# Patient Record
Sex: Female | Born: 1995 | Race: White | Hispanic: No | Marital: Single | Smoking: Never smoker
Health system: Southern US, Community
[De-identification: ages and names within clinical notes are randomized; demographics above are authoritative.]

## PROBLEM LIST (undated history)

## (undated) DIAGNOSIS — L639 Alopecia areata, unspecified: Secondary | ICD-10-CM

## (undated) DIAGNOSIS — F419 Anxiety disorder, unspecified: Secondary | ICD-10-CM

## (undated) DIAGNOSIS — G43909 Migraine, unspecified, not intractable, without status migrainosus: Secondary | ICD-10-CM

## (undated) DIAGNOSIS — F32A Depression, unspecified: Secondary | ICD-10-CM

## (undated) HISTORY — DX: Anxiety disorder, unspecified: F41.9

## (undated) HISTORY — PX: OTHER SURGICAL HISTORY: SHX169

## (undated) HISTORY — PX: TYMPANOSTOMY TUBE PLACEMENT: SHX32

## (undated) HISTORY — DX: Migraine, unspecified, not intractable, without status migrainosus: G43.909

## (undated) HISTORY — DX: Alopecia areata, unspecified: L63.9

## (undated) HISTORY — DX: Depression, unspecified: F32.A

---
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
## 2013-03-17 ENCOUNTER — Encounter: Payer: Self-pay | Admitting: Neurology

## 2013-03-17 ENCOUNTER — Ambulatory Visit (INDEPENDENT_AMBULATORY_CARE_PROVIDER_SITE_OTHER): Payer: 59 | Admitting: Neurology

## 2013-03-17 VITALS — BP 99/55 | HR 72 | Ht 64.0 in | Wt 104.0 lb

## 2013-03-17 DIAGNOSIS — F411 Generalized anxiety disorder: Secondary | ICD-10-CM

## 2013-03-17 DIAGNOSIS — G43909 Migraine, unspecified, not intractable, without status migrainosus: Secondary | ICD-10-CM | POA: Insufficient documentation

## 2013-03-17 DIAGNOSIS — F419 Anxiety disorder, unspecified: Secondary | ICD-10-CM | POA: Insufficient documentation

## 2013-03-17 MED ORDER — RIZATRIPTAN BENZOATE 10 MG PO TABS: 10.0000 mg | ORAL_TABLET | ORAL | Status: DC | PRN

## 2013-03-17 MED ORDER — NORTRIPTYLINE HCL 10 MG PO CAPS: 20.0000 mg | ORAL_CAPSULE | Freq: Every day | ORAL | Status: DC

## 2013-03-17 NOTE — Patient Instructions (Signed)
magnesium oxide 400mg , riboflavin 100mg  twice a day

## 2013-03-17 NOTE — Progress Notes (Signed)
[facility]  PATIENT: Cynthia Reynolds DOB: 03/04/1996  HISTORICAL  Cynthia Reynolds is 17 years old right-handed Caucasian female, accompanied by her mother, referred by her primary care physician Dr. Duane Lope for evaluation of headaches  She had past medical history of migraine headaches, anxiety, currently a college student  Her migraine started about 3 years ago, in 2011, bifrontal, vertex area headaches, getting worse over the past 2 years, it was attributed to her stress, her headace continues to get worse, to the point of headache every other day, she took Advil 3 tablets 3-4 times each week to treat her headache, it can last all day, or few hours, pressure, occasionally pounding, light sensitivity, worsening by movement,  Trigger for migraine are menstruation, stress, she denies visual change, no lateralized motor or sensory deficit.  REVIEW OF SYSTEMS: Full 14 system review of systems performed and notable only for blurry vision, muscle cramping, occasionally constipation, headaches, sleepiness, dizziness,  ALLERGIES: Allergies  Allergen Reactions  . Amoxicillin     Swelling and rash  . Augmentin (Amoxicillin-Pot Clavulanate)     Swelling and rash  . Amoxapine And Related   . Biaxin (Clarithromycin)     rash  . Cefzil (Cefprozil)     rash  . Septra (Sulfamethoxazole W-Trimethoprim)     rash    HOME MEDICATIONS: No outpatient prescriptions prior to visit.   No facility-administered medications prior to visit.    PAST MEDICAL HISTORY: Past Medical History  Diagnosis Date  . Migraine   . Anxiety     PAST SURGICAL HISTORY: Past Surgical History  Procedure Laterality Date  . Tubes in ears Left     2011    FAMILY HISTORY: Family History  Problem Relation Age of Onset  . Migraines Mother   . Migraines Father   . High blood pressure Maternal Grandmother   . High blood pressure Maternal Grandmother   . High blood pressure Maternal Aunt      SOCIAL HISTORY:  History   Social History  . Marital Status: Single    Spouse Name: N/A    Number of Children: 0  . Years of Education: college   Occupational History  . STUDENT     [employer]   Social History Main Topics  . Smoking status: Never Smoker   . Smokeless tobacco: Never Used  . Alcohol Use: No  . Drug Use: No  . Sexually Active: Not on file   Other Topics Concern  . Not on file   Social History Narrative   Patient lives at home with her parents. Patient goes to [employer].    Right handed   Caffeine - 2 cups     PHYSICAL EXAM    Filed Vitals:   03/17/13 1247  BP: 99/55  Pulse: 72  Height: 5\' 4"  (1.626 m)  Weight: 104 lb (47.174 kg)    Not recorded    Body mass index is 17.84 kg/(m^2).   Generalized: In no acute distress  Neck: Supple, no carotid bruits   Cardiac: Regular rate rhythm  Pulmonary: Clear to auscultation bilaterally  Musculoskeletal: No deformity  Neurological examination  Mentation: Alert oriented to time, place, history taking, and causual conversation  Cranial nerve II-XII: Pupils were equal round reactive to light extraocular movements were full, visual field were full on confrontational test. facial sensation and strength were normal. hearing was intact to finger rubbing bilaterally. Uvula tongue midline.  head turning and shoulder shrug and were normal and  symmetric.Tongue protrusion into cheek strength was normal.  Motor: normal tone, bulk and strength.  Sensory: Intact to fine touch, pinprick, preserved vibratory sensation, and proprioception at toes.  Coordination: Normal finger to nose, heel-to-shin bilaterally there was no truncal ataxia  Gait: Rising up from seated position without assistance, normal stance, without trunk ataxia, moderate stride, good arm swing, smooth turning, able to perform tiptoe, and heel walking without difficulty.   Romberg signs: Negative  Deep tendon reflexes: Brachioradialis 2/2,  biceps 2/2, triceps 2/2, patellar 2/2, Achilles 2/2, plantar responses were flexor bilaterally.   DIAGNOSTIC DATA (LABS, IMAGING, TESTING) - I reviewed patient records, labs, notes, testing and imaging myself where available.  Lab Results  Component Value Date   WBC 8.5 10/20/2008   HGB 14.2 10/20/2008   HCT 42.3 10/20/2008   MCV 91.0 10/20/2008   PLT 242 10/20/2008      Component Value Date/Time   NA 142 10/20/2008 1130   K 3.8 10/20/2008 1130   CL 109 10/20/2008 1130   CO2 27 10/20/2008 1130   GLUCOSE 80 10/20/2008 1130   BUN 8 10/20/2008 1130   CREATININE 0.44 10/20/2008 1130   CALCIUM 9.9 10/20/2008 1130   PROT 7.3 10/20/2008 1130   ALBUMIN 4.2 10/20/2008 1130   AST 40* 10/20/2008 1130   ALT 45* 10/20/2008 1130   ALKPHOS 133 10/20/2008 1130   BILITOT 0.9 10/20/2008 1130   GFRNONAA NOT CALCULATED 10/20/2008 1130   GFRAA  Value: NOT CALCULATED        The eGFR has been calculated using the MDRD equation. This calculation has not been validated in all clinical situations. eGFR's persistently <60 mL/min signify possible Chronic Kidney Disease. 10/20/2008 1130    ASSESSMENT AND PLAN 17 years old patient female, with migraine headaches, likely a component of medicine  rebound headache, normal neurological examination  1. nortriptyline 10 mg, titrating to 20 mg every night as preventive medications 2 Maxalt as needed only for severe migraine headaches, cutback frequent Advil use 3 magnesium oxide, riboflavin twice a day.       Levert Feinstein, M.D. Ph.D.  [facility] [address] [place], [place] [address] [phone]

## 2014-10-01 DIAGNOSIS — H7291 Unspecified perforation of tympanic membrane, right ear: Secondary | ICD-10-CM | POA: Insufficient documentation

## 2017-09-30 ENCOUNTER — Encounter: Payer: Self-pay | Admitting: Allergy and Immunology

## 2018-08-18 DIAGNOSIS — F3342 Major depressive disorder, recurrent, in full remission: Secondary | ICD-10-CM | POA: Insufficient documentation

## 2020-12-08 ENCOUNTER — Ambulatory Visit (INDEPENDENT_AMBULATORY_CARE_PROVIDER_SITE_OTHER): Payer: PRIVATE HEALTH INSURANCE | Admitting: Medical-Surgical

## 2020-12-08 ENCOUNTER — Encounter: Payer: Self-pay | Admitting: Medical-Surgical

## 2020-12-08 ENCOUNTER — Other Ambulatory Visit: Payer: Self-pay

## 2020-12-08 VITALS — BP 100/67 | HR 98 | Temp 98.7°F | Ht 64.0 in | Wt 114.2 lb

## 2020-12-08 DIAGNOSIS — H66002 Acute suppurative otitis media without spontaneous rupture of ear drum, left ear: Secondary | ICD-10-CM | POA: Diagnosis not present

## 2020-12-08 DIAGNOSIS — L659 Nonscarring hair loss, unspecified: Secondary | ICD-10-CM

## 2020-12-08 DIAGNOSIS — Z7689 Persons encountering health services in other specified circumstances: Secondary | ICD-10-CM

## 2020-12-08 DIAGNOSIS — Z1159 Encounter for screening for other viral diseases: Secondary | ICD-10-CM

## 2020-12-08 DIAGNOSIS — Z114 Encounter for screening for human immunodeficiency virus [HIV]: Secondary | ICD-10-CM

## 2020-12-08 MED ORDER — CLOBETASOL PROPIONATE 0.05 % EX SOLN: 1.0000 "application " | Freq: Two times a day (BID) | CUTANEOUS | 0 refills | Status: DC

## 2020-12-08 MED ORDER — AZITHROMYCIN 250 MG PO TABS: ORAL_TABLET | ORAL | 0 refills | Status: DC

## 2020-12-08 NOTE — Progress Notes (Signed)
New Patient Office Visit  Subjective:  Patient ID: Cynthia Reynolds, female    DOB: October 10, 1995  Age: 25 y.o. MRN: [id number]  CC:  Chief Complaint  Patient presents with  . Establish Care  . Alopecia    HPI Cynthia Reynolds presents to establish care.   Alopecia- seen in 2020 for hair loss on the left side of the head just behind the ear. Saw dermatology for one visit and was diagnosed with alopecia areata. They gave her Clobetasol solution to use twice daily until hair growth restarted then decrease to once daily. She had an insurance change and was unable to follow up with them. She is currently out of the solution and notes that she has again started losing hair in the same area. She is vegetarian and trends towards starchy, cheesy foods. Not taking a daily MVI.   Left ear- having some pain and muffled hearing in her left ear. Saw UC for her symptoms two weeks ago and they recommended Zyrtec-D for it. She has done this as directed for the last two weeks and had no improvement. No using steroid nasal spray. Long history of ear problems with multiple surgeries/procedures. Now waking with ear pain and some intermittent dizziness. No fevers, chills, or other upper respiratory symptoms.   Past Medical History:  Diagnosis Date  . Anxiety   . Migraine     Past Surgical History:  Procedure Laterality Date  . tubes in ears Left    2011    Family History  Problem Relation Age of Onset  . Migraines Mother   . Migraines Father   . High blood pressure Maternal Grandmother   . High blood pressure Maternal Aunt     Social History   Socioeconomic History  . Marital status: Single    Spouse name: Not on file  . Number of children: 0  . Years of education: college  . Highest education level: Not on file  Occupational History  . Occupation: Dentist: UNEMPLOYED    Comment: [employer]  Tobacco Use  . Smoking status: Never Smoker  . Smokeless tobacco: Never Used  Substance  and Sexual Activity  . Alcohol use: Yes    Alcohol/week: 1.0 - 2.0 standard drink    Types: 1 - 2 Standard drinks or equivalent per week  . Drug use: No  . Sexual activity: Yes    Birth control/protection: Pill  Other Topics Concern  . Not on file  Social History Narrative   Patient lives at home with her parents. Patient goes to [employer].    Right handed   Caffeine - 2 cups   Social Determinants of Health   Financial Resource Strain: Not on file  Food Insecurity: Not on file  Transportation Needs: Not on file  Physical Activity: Not on file  Stress: Not on file  Social Connections: Not on file  Intimate Partner Violence: Not on file   ROS Review of Systems  Constitutional: Negative for chills, fatigue, fever and unexpected weight change.  HENT: Positive for ear pain (left ear).   Respiratory: Negative for cough, chest tightness, shortness of breath and wheezing.   Cardiovascular: Negative for chest pain, palpitations and leg swelling.  Neurological: Positive for dizziness (intermittent).   Objective:   Today's Vitals: BP 100/67   Pulse 98   Temp 98.7 F (37.1 C)   Ht 5\' 4"  (1.626 m)   Wt 114 lb 3.2 oz (51.8 kg)   LMP 12/08/2020  SpO2 100%   BMI 19.60 kg/m   Physical Exam Vitals reviewed.  Constitutional:      General: She is not in acute distress.    Appearance: Normal appearance.  HENT:     Head: Normocephalic and atraumatic.     Right Ear: Ear canal and external ear normal. There is no impacted cerumen.     Left Ear: Ear canal and external ear normal. There is no impacted cerumen.     Ears:     Comments: Tympanic scarring noted bilaterally. No bulging or erythema. Cloudy fluid noted behind the left TM.  Cardiovascular:     Rate and Rhythm: Normal rate and regular rhythm.     Pulses: Normal pulses.     Heart sounds: Normal heart sounds. No murmur heard. No friction rub. No gallop.   Pulmonary:     Effort: Pulmonary effort is normal. No respiratory  distress.     Breath sounds: Normal breath sounds. No wheezing.  Skin:    General: Skin is warm and dry.  Neurological:     Mental Status: She is alert and oriented to person, place, and time.  Psychiatric:        Mood and Affect: Mood normal.        Behavior: Behavior normal.        Thought Content: Thought content normal.        Judgment: Judgment normal.    Assessment & Plan:   1. Encounter to establish care Reviewed available information and discussed care concerns with patient.   2. Alopecia Discussed alopecia areata diagnosis and the chronic nature of the condition. Currently having a relapse. Refilling Clobetasol solution. Referring to dermatology to talk about possible other treatments including intralesional injections. Discussed the inflammatory nature of the condition and the possibility of nutritional deficiency as a contributor. Consider starting a daily MVI. Recommend looking into information on the anti-inflammatory diet and foods that promote inflammation.  - Ambulatory referral to Dermatology  3. Non-recurrent acute suppurative otitis media of left ear without spontaneous rupture of tympanic membrane With poor response and worsening of symptoms, treating with Azithromycin (previously tolerated well). Recommend adding a daily nasal steroid spray. Ok to continue Zyrtec-D for now.   Outpatient Encounter Medications as of 12/08/2020  Medication Sig  . azithromycin (ZITHROMAX) 250 MG tablet 2 tabs po x1 on Day 1, then 1 tab po daily on Days 2 - 5  . cetirizine-pseudoephedrine (ZYRTEC-D) 5-120 MG tablet Take 1 tablet by mouth 2 (two) times daily as needed for allergies.  . clobetasol (TEMOVATE) 0.05 % external solution Apply 1 application topically 2 (two) times daily.  . TRI-SPRINTEC 0.18/0.215/0.25 MG-35 MCG tablet Take 1 tablet by mouth daily.  . [DISCONTINUED] ibuprofen (ADVIL,MOTRIN) 200 MG tablet Take 200 mg by mouth 3 (three) times daily.  . [DISCONTINUED]  nortriptyline (PAMELOR) 10 MG capsule Take 2 capsules (20 mg total) by mouth at bedtime. Take one po qhs xone week.  . [DISCONTINUED] rizatriptan (MAXALT) 10 MG tablet Take 1 tablet (10 mg total) by mouth as needed for migraine. May repeat in 2 hours if needed   No facility-administered encounter medications on file as of 12/08/2020.   Follow-up: Return if symptoms worsen or fail to improve.   Thayer Ohm, DNP, APRN, FNP-BC [facility]

## 2020-12-27 ENCOUNTER — Encounter: Payer: Self-pay | Admitting: Medical-Surgical

## 2021-01-03 ENCOUNTER — Other Ambulatory Visit: Payer: Self-pay

## 2021-01-03 ENCOUNTER — Encounter: Payer: Self-pay | Admitting: Medical-Surgical

## 2021-01-03 ENCOUNTER — Ambulatory Visit (INDEPENDENT_AMBULATORY_CARE_PROVIDER_SITE_OTHER): Payer: 59 | Admitting: Medical-Surgical

## 2021-01-03 VITALS — BP 100/63 | HR 77 | Temp 99.2°F | Ht 64.0 in | Wt 113.4 lb

## 2021-01-03 DIAGNOSIS — R21 Rash and other nonspecific skin eruption: Secondary | ICD-10-CM

## 2021-01-03 MED ORDER — FLUOCINOLONE ACETONIDE 0.025 % EX CREA: TOPICAL_CREAM | Freq: Two times a day (BID) | CUTANEOUS | 0 refills | Status: DC

## 2021-01-03 NOTE — Progress Notes (Signed)
Subjective:    CC: facial rash  HPI: Pleasant 25 year old female presenting today with complaints of a facial rash that has been present over the last 2 weeks. The rash is intermittent with no identifiable aggravating and/or alleviating factors. Described as redness, blotchy, and flat. Affects the right cheek most of the time but has affected the left a little. A couple of times has gone down as far as the upper neck. No bumps, elevation, or crusting noted. Rash is itchy, worse after a hot shower. No new medications, foods, chemicals, cosmetics, materials, or detergents. She has tried different masks but the rash is still present.   I reviewed the past medical history, family history, social history, surgical history, and allergies today and no changes were needed.  Please see the problem list section below in epic for further details.  Past Medical History: Past Medical History:  Diagnosis Date  . Alopecia areata   . Anxiety   . Depression   . Migraine    Past Surgical History: Past Surgical History:  Procedure Laterality Date  . TYMPANOSTOMY TUBE PLACEMENT     Social History: Social History   Socioeconomic History  . Marital status: Single    Spouse name: Not on file  . Number of children: 0  . Years of education: college  . Highest education level: Not on file  Occupational History  . Occupation: Dentist: UNEMPLOYED    Comment: [employer]  Tobacco Use  . Smoking status: Never Smoker  . Smokeless tobacco: Never Used  Substance and Sexual Activity  . Alcohol use: Yes    Alcohol/week: 1.0 - 2.0 standard drink    Types: 1 - 2 Standard drinks or equivalent per week  . Drug use: No  . Sexual activity: Yes    Birth control/protection: Pill  Other Topics Concern  . Not on file  Social History Narrative   Patient lives at home with her parents. Patient goes to [employer].    Right handed   Caffeine - 2 cups   Social Determinants of Health   Financial  Resource Strain: Not on file  Food Insecurity: Not on file  Transportation Needs: Not on file  Physical Activity: Not on file  Stress: Not on file  Social Connections: Not on file   Family History: Family History  Problem Relation Age of Onset  . Migraines Mother   . Migraines Father   . High blood pressure Maternal Grandmother   . High blood pressure Maternal Aunt   . Congestive Heart Failure Other   . Hypertension Other   . Ovarian cancer Other    Allergies: Allergies  Allergen Reactions  . Amoxapine And Related Anaphylaxis  . Amoxicillin Anaphylaxis and Rash    Swelling and rash  . Amoxicillin-Pot Clavulanate Anaphylaxis    Swelling and rash  . Sulfamethoxazole-Trimethoprim Rash       . Cefprozil Diarrhea and Rash  . Clarithromycin Rash   Medications: See med rec.  Review of Systems: See HPI for pertinent positives and negatives.   Objective:    General: Well Developed, well nourished, and in no acute distress.  Neuro: Alert and oriented x3.  HEENT: Normocephalic, atraumatic.  Skin: Warm and dry. Small erythematous patch to the right check near the nasolabial fold without papules, pustules, vesicles, or crusting.  Cardiac: Regular rate and rhythm, no murmurs rubs or gallops, no lower extremity edema.  Respiratory: Clear to auscultation bilaterally. Not using accessory muscles, speaking in full sentences.  Impression and Recommendations:    1. Rash Unclear etiology. Possible contact dermatitis vs a dermatologic manifestation of her severe seasonal allergies. Recommend changing daily antihistamine to Xyzal/Allegra/Claritin. Start Flonase. Ok to use Benadryl at night to help with itching. Start fluocinonide cream BID to the affected area. Cautioned to use sparingly and avoid prolonged use. Has an appointment with dermatology in 3-4 weeks so they may have further input.   Return if symptoms worsen or fail to improve. ___________________________________________ Thayer Ohm, DNP, APRN, FNP-BC Primary [facility] and Sports Medicine [facility]

## 2021-01-30 ENCOUNTER — Ambulatory Visit: Payer: PRIVATE HEALTH INSURANCE | Admitting: Dermatology

## 2021-01-30 ENCOUNTER — Other Ambulatory Visit: Payer: Self-pay

## 2021-01-30 DIAGNOSIS — L639 Alopecia areata, unspecified: Secondary | ICD-10-CM

## 2021-01-30 DIAGNOSIS — L719 Rosacea, unspecified: Secondary | ICD-10-CM | POA: Diagnosis not present

## 2021-01-30 MED ORDER — PIMECROLIMUS 1 % EX CREA: TOPICAL_CREAM | CUTANEOUS | 1 refills | Status: DC

## 2021-01-30 NOTE — Progress Notes (Signed)
   New Patient Visit  Subjective  Cynthia Reynolds is a 25 y.o. female who presents for the following: Hairloss (Patient has a history of hairloss that started in 2020. She saw a dermatologist that prescribed clobetasol solution. Areas improved, but when she ran out, the area worsened again. She recently saw her PCP in March and was prescribed the same solution again, which is helping. ) and Rash (Rash on face started 2 months ago. Sometimes itchy. She is using fluocinonide acetonide cream 0.025%, but not improving redness. Redness comes and goes, not flaky.). Unaware of family history of rosacea.   The following portions of the chart were reviewed this encounter and updated as appropriate:       Review of Systems:  No other skin or systemic complaints except as noted in HPI or Assessment and Plan.  Objective  Well appearing patient in no apparent distress; mood and affect are within normal limits.  A focused examination was performed including face, scalp. Relevant physical exam findings are noted in the Assessment and Plan.  Objective  L scalp above ear: Hair thinning scalp above left ear, 4.5 x 3.5cm patch of alopecia with regrowth.  Objective  Cheeks: Mild erythema of the cheeks.   Assessment & Plan    Alopecia areata L scalp above ear  Improving on topical clobetasol solution  Alopecia areata is a chronic autoimmune condition localized to the skin which affects hair follicles and causes hair loss, most commonly in the scalp.  Cause is unknown.  Can be unpredictable, difficult to treat, and may recur.  Treatment methods include use of topical and intralesional steroids to decrease inflammation to allow for hair regrowth.   Continue clobetasol solution qd/bid to AA until improved. Avoid face.  May try 5% Rogaine Foam qd/bid to AA.  If worsens, will start IL steroid injections  Topical steroids (such as triamcinolone, fluocinolone, fluocinonide, mometasone, clobetasol,  halobetasol, betamethasone, hydrocortisone) can cause thinning and lightening of the skin if they are used for too long in the same area. Your physician has selected the right strength medicine for your problem and area affected on the body. Please use your medication only as directed by your physician to prevent side effects.    Rosacea Cheeks  Mild Rosacea vs Partially Treated Eczema  Rosacea is a chronic progressive skin condition usually affecting the face of adults, causing redness and/or acne bumps. It is treatable but not curable. It sometimes affects the eyes (ocular rosacea) as well. It may respond to topical and/or systemic medication and can flare with stress, sun exposure, alcohol, exercise and some foods.  Daily application of broad spectrum spf 30+ sunscreen to face is recommended to reduce flares. Samples of Aveeno moisturizer with Mineral Sunscreen. Sample of CeraVe Hydrating Cleanser given. D/C Salicylic Acid Cleanser.  d/c fluocinonide cream due to risk increased redness and skin atrophy  Start Elidel Cream Apply to AA face qd/bid dsp 30g 1Rf.  If no improvement noted, will add topical rosacea cream.     pimecrolimus (ELIDEL) 1 % cream - Cheeks  Return in about 2 months (around 04/01/2021) for alopecia areata, rosacea/eczema.  ICherlyn Labella, CMA, am acting as scribe for Willeen Niece, MD .  Documentation: I have reviewed the above documentation for accuracy and completeness, and I agree with the above.  Willeen Niece MD

## 2021-01-30 NOTE — Patient Instructions (Addendum)
Alopecia areata is a chronic autoimmune condition localized to the skin which affects hair follicles and causes hair loss, most commonly in the scalp.  Cause is unknown.  Can be unpredictable, difficult to treat, and may recur.  Treatment methods include use of topical and intralesional steroids to decrease inflammation to allow for hair regrowth.  Topical steroids (such as triamcinolone, fluocinolone, fluocinonide, mometasone, clobetasol, halobetasol, betamethasone, hydrocortisone) can cause thinning and lightening of the skin if they are used for too long in the same area. Your physician has selected the right strength medicine for your problem and area affected on the body. Please use your medication only as directed by your physician to prevent side effects.   Recommend broad spectrum SPF 30 or greater and photoprotection.  If you have any questions or concerns for your doctor, please call our main line at [phone] and press option 4 to reach your doctor's medical assistant. If no one answers, please leave a voicemail as directed and we will return your call as soon as possible. Messages left after 4 pm will be answered the following business day.   You may also send [place] a message via MyChart. We typically respond to MyChart messages within 1-2 business days.  For prescription refills, please ask your pharmacy to contact our office. Our fax number is [phone].  If you have an urgent issue when the clinic is closed that cannot wait until the next business day, you can page your doctor at the number below.    Please note that while we do our best to be available for urgent issues outside of office hours, we are not available 24/7.   If you have an urgent issue and are unable to reach [place], you may choose to seek medical care at your doctor's office, retail clinic, urgent care center, or emergency room.  If you have a medical emergency, please immediately call 911 or go to the emergency  department.  Pager Numbers  - Dr. Gwen Pounds: [phone]  - Dr. Neale Burly: [phone]  - Dr. Roseanne Reno: [phone]  In the event of inclement weather, please call our main line at [phone] for an update on the status of any delays or closures.  Dermatology Medication Tips: Please keep the boxes that topical medications come in in order to help keep track of the instructions about where and how to use these. Pharmacies typically print the medication instructions only on the boxes and not directly on the medication tubes.   If your medication is too expensive, please contact our office at [phone] option 4 or send [place] a message through MyChart.   We are unable to tell what your co-pay for medications will be in advance as this is different depending on your insurance coverage. However, we may be able to find a substitute medication at lower cost or fill out paperwork to get insurance to cover a needed medication.   If a prior authorization is required to get your medication covered by your insurance company, please allow [place] 1-2 business days to complete this process.  Drug prices often vary depending on where the prescription is filled and some pharmacies may offer cheaper prices.  The website www.goodrx.com contains coupons for medications through different pharmacies. The prices here do not account for what the cost may be with help from insurance (it may be cheaper with your insurance), but the website can give you the price if you did not use any insurance.  - You can print the associated coupon and  take it with your prescription to the pharmacy.  - You may also stop by our office during regular business hours and pick up a GoodRx coupon card.  - If you need your prescription sent electronically to a different pharmacy, notify our office through [facility] or by phone at [phone] option 4.

## 2021-02-01 ENCOUNTER — Encounter: Payer: Self-pay | Admitting: Dermatology

## 2021-02-08 ENCOUNTER — Other Ambulatory Visit: Payer: Self-pay

## 2021-02-08 DIAGNOSIS — L719 Rosacea, unspecified: Secondary | ICD-10-CM

## 2021-02-08 MED ORDER — METRONIDAZOLE 0.75 % EX GEL: CUTANEOUS | 3 refills | Status: DC

## 2021-03-24 ENCOUNTER — Encounter: Payer: 59 | Admitting: Medical-Surgical

## 2021-03-28 ENCOUNTER — Ambulatory Visit (INDEPENDENT_AMBULATORY_CARE_PROVIDER_SITE_OTHER): Payer: 59 | Admitting: Medical-Surgical

## 2021-03-28 ENCOUNTER — Other Ambulatory Visit: Payer: Self-pay

## 2021-03-28 ENCOUNTER — Encounter: Payer: Self-pay | Admitting: Medical-Surgical

## 2021-03-28 ENCOUNTER — Ambulatory Visit (INDEPENDENT_AMBULATORY_CARE_PROVIDER_SITE_OTHER): Payer: 59 | Admitting: Dermatology

## 2021-03-28 VITALS — BP 92/59 | HR 68 | Temp 98.8°F | Ht 65.0 in | Wt 111.6 lb

## 2021-03-28 DIAGNOSIS — Z0000 Encounter for general adult medical examination without abnormal findings: Secondary | ICD-10-CM

## 2021-03-28 DIAGNOSIS — L719 Rosacea, unspecified: Secondary | ICD-10-CM

## 2021-03-28 DIAGNOSIS — Z1329 Encounter for screening for other suspected endocrine disorder: Secondary | ICD-10-CM | POA: Diagnosis not present

## 2021-03-28 DIAGNOSIS — L739 Follicular disorder, unspecified: Secondary | ICD-10-CM | POA: Diagnosis not present

## 2021-03-28 DIAGNOSIS — L639 Alopecia areata, unspecified: Secondary | ICD-10-CM | POA: Diagnosis not present

## 2021-03-28 DIAGNOSIS — Z8349 Family history of other endocrine, nutritional and metabolic diseases: Secondary | ICD-10-CM | POA: Diagnosis not present

## 2021-03-28 DIAGNOSIS — R112 Nausea with vomiting, unspecified: Secondary | ICD-10-CM

## 2021-03-28 LAB — TSH: TSH: 2.48 mIU/L

## 2021-03-28 MED ORDER — ONDANSETRON 8 MG PO TBDP: 8.0000 mg | ORAL_TABLET | Freq: Three times a day (TID) | ORAL | 3 refills | Status: DC | PRN

## 2021-03-28 NOTE — Patient Instructions (Signed)
Preventive Care 21-27 Years Old, Female Preventive care refers to lifestyle choices and visits with your health care provider that can promote health and wellness. This includes: A yearly physical exam. This is also called an annual wellness visit. Regular dental and eye exams. Immunizations. Screening for certain conditions. Healthy lifestyle choices, such as: Eating a healthy diet. Getting regular exercise. Not using drugs or products that contain nicotine and tobacco. Limiting alcohol use. What can I expect for my preventive care visit? Physical exam Your health care provider may check your: Height and weight. These may be used to calculate your BMI (body mass index). BMI is a measurement that tells if you are at a healthy weight. Heart rate and blood pressure. Body temperature. Skin for abnormal spots. Counseling Your health care provider may ask you questions about your: Past medical problems. Family's medical history. Alcohol, tobacco, and drug use. Emotional well-being. Home life and relationship well-being. Sexual activity. Diet, exercise, and sleep habits. Work and work environment. Access to firearms. Method of birth control. Menstrual cycle. Pregnancy history. What immunizations do I need?  Vaccines are usually given at various ages, according to a schedule. Your health care provider will recommend vaccines for you based on your age, medicalhistory, and lifestyle or other factors, such as travel or where you work. What tests do I need?  Blood tests Lipid and cholesterol levels. These may be checked every 5 years starting at age 20. Hepatitis C test. Hepatitis B test. Screening Diabetes screening. This is done by checking your blood sugar (glucose) after you have not eaten for a while (fasting). STD (sexually transmitted disease) testing, if you are at risk. BRCA-related cancer screening. This may be done if you have a family history of breast, ovarian, tubal, or  peritoneal cancers. Pelvic exam and Pap test. This may be done every 3 years starting at age 21. Starting at age 30, this may be done every 5 years if you have a Pap test in combination with an HPV test. Talk with your health care provider about your test results, treatment options,and if necessary, the need for more tests. Follow these instructions at home: Eating and drinking  Eat a healthy diet that includes fresh fruits and vegetables, whole grains, lean protein, and low-fat dairy products. Take vitamin and mineral supplements as recommended by your health care provider. Do not drink alcohol if: Your health care provider tells you not to drink. You are pregnant, may be pregnant, or are planning to become pregnant. If you drink alcohol: Limit how much you have to 0-1 drink a day. Be aware of how much alcohol is in your drink. In the [place] one drink equals one 12 oz bottle of beer (355 mL), one 5 oz glass of wine (148 mL), or one 1 oz glass of hard liquor (44 mL).  Lifestyle Take daily care of your teeth and gums. Brush your teeth every morning and night with fluoride toothpaste. Floss one time each day. Stay active. Exercise for at least 30 minutes 5 or more days each week. Do not use any products that contain nicotine or tobacco, such as cigarettes, e-cigarettes, and chewing tobacco. If you need help quitting, ask your health care provider. Do not use drugs. If you are sexually active, practice safe sex. Use a condom or other form of protection to prevent STIs (sexually transmitted infections). If you do not wish to become pregnant, use a form of birth control. If you plan to become pregnant, see your health care   provider for a prepregnancy visit. Find healthy ways to cope with stress, such as: Meditation, yoga, or listening to music. Journaling. Talking to a trusted person. Spending time with friends and family. Safety Always wear your seat belt while driving or riding in a  vehicle. Do not drive: If you have been drinking alcohol. Do not ride with someone who has been drinking. When you are tired or distracted. While texting. Wear a helmet and other protective equipment during sports activities. If you have firearms in your house, make sure you follow all gun safety procedures. Seek help if you have been physically or sexually abused. What's next? Go to your health care provider once a year for an annual wellness visit. Ask your health care provider how often you should have your eyes and teeth checked. Stay up to date on all vaccines. This information is not intended to replace advice given to you by your health care provider. Make sure you discuss any questions you have with your healthcare provider. Document Revised: 05/08/2020 Document Reviewed: 05/22/2018 Elsevier Patient Education  2022 Elsevier Inc.  

## 2021-03-28 NOTE — Progress Notes (Signed)
HPI: Cynthia Reynolds is a 25 y.o. female who  has a past medical history of Alopecia areata, Anxiety, Depression, and Migraine.  she presents to [facility] today, 03/28/21,  for chief complaint of: Annual physical exam  Dentist: overdue, no concerns Eye exam: a couple of years ago, reading glasses Exercise: none intentional  Diet: vegetarian, dairy and eggs but no fish Pap smear: UTD, 10/06/2019 normal COVID vaccine: Done and boosted  Concerns: None  Past medical, surgical, social and family history reviewed:  Patient Active Problem List   Diagnosis Date Noted   Family history of thyroid disease 03/28/2021   Alopecia 12/08/2020   Anxiety    Migraines 12/07/2011    Past Surgical History:  Procedure Laterality Date   TYMPANOSTOMY TUBE PLACEMENT      Social History   Tobacco Use   Smoking status: Never   Smokeless tobacco: Never  Substance Use Topics   Alcohol use: Yes    Alcohol/week: 1.0 - 2.0 standard drink    Types: 1 - 2 Standard drinks or equivalent per week    Family History  Problem Relation Age of Onset   Migraines Mother    Migraines Father    High blood pressure Maternal Grandmother    High blood pressure Maternal Aunt    Congestive Heart Failure Other    Hypertension Other    Ovarian cancer Other      Current medication list and allergy/intolerance information reviewed:    Current Outpatient Medications  Medication Sig Dispense Refill   cetirizine-pseudoephedrine (ZYRTEC-D) 5-120 MG tablet Take 1 tablet by mouth 2 (two) times daily as needed for allergies.     clobetasol (TEMOVATE) 0.05 % external solution Apply 1 application topically 2 (two) times daily. 100 mL 0   metroNIDAZOLE (METROGEL) 0.75 % gel Apply to the face BID. 45 g 3   ondansetron (ZOFRAN-ODT) 8 MG disintegrating tablet Take 1 tablet (8 mg total) by mouth every 8 (eight) hours as needed for nausea. 20 tablet 3   TRI-SPRINTEC 0.18/0.215/0.25  MG-35 MCG tablet Take 1 tablet by mouth daily.     No current facility-administered medications for this visit.    Allergies  Allergen Reactions   Amoxapine And Related Anaphylaxis   Amoxicillin Anaphylaxis and Rash    Swelling and rash   Amoxicillin-Pot Clavulanate Anaphylaxis    Swelling and rash   Sulfamethoxazole-Trimethoprim Rash        Cefprozil Diarrhea and Rash   Clarithromycin Rash   Review of Systems: Constitutional:  No  fever, no chills, No recent illness, No unintentional weight changes. No significant fatigue.  HEENT: No  headache, no vision change, no hearing change, No sore throat, No  sinus pressure Cardiac: No  chest pain, No  pressure, No palpitations, No  Orthopnea Respiratory:  No  shortness of breath. No  Cough Gastrointestinal: No  abdominal pain, + nausea, +  vomiting,  No  blood in stool, No  diarrhea, No  constipation  Musculoskeletal: No new myalgia/arthralgia Skin: No  Rash, No other wounds/concerning lesions Genitourinary: No  incontinence, No  abnormal genital bleeding, No abnormal genital discharge Hem/Onc: No  easy bruising/bleeding, No  abnormal lymph node Endocrine: No cold intolerance,  No heat intolerance. No polyuria/polydipsia/polyphagia  Neurologic: No  weakness, No  dizziness, No  slurred speech/focal weakness/facial droop Psychiatric: No  concerns with depression, No  concerns with anxiety, No sleep problems, No mood problems  Exam:  BP (!) 92/59   Pulse 68  Temp 98.8 F (37.1 C)   Ht 5' 5"  (1.651 m)   Wt 111 lb 9.6 oz (50.6 kg)   LMP 11/24/2020   SpO2 99%   BMI 18.57 kg/m  Constitutional: VS see above. General Appearance: alert, well-developed, well-nourished, NAD Eyes: Normal lids and conjunctive, non-icteric sclera Ears, Nose, Mouth, Throat: MMM, Normal external inspection ears/nares/mouth/lips/gums. TM normal bilaterally. Pharynx/tonsils no erythema, no exudate. Nasal mucosa normal.  Neck: No masses, trachea midline. No  thyroid enlargement. No tenderness/mass appreciated. No lymphadenopathy Respiratory: Normal respiratory effort. no wheeze, no rhonchi, no rales Cardiovascular: S1/S2 normal, no murmur, no rub/gallop auscultated. RRR. No lower extremity edema. Pedal pulse II/IV bilaterally DP and PT. No carotid bruit or JVD. No abdominal aortic bruit. Gastrointestinal: Nontender, no masses. No hepatomegaly, no splenomegaly. No hernia appreciated. Bowel sounds normal. Rectal exam deferred.  Musculoskeletal: Gait normal. No clubbing/cyanosis of digits.  Neurological: Normal balance/coordination. No tremor. No cranial nerve deficit on limited exam. Motor and sensation intact and symmetric. Cerebellar reflexes intact.  Skin: warm, dry, intact. No rash/ulcer. No concerning nevi or subq nodules on limited exam.   Psychiatric: Normal judgment/insight. Normal mood and affect. Oriented x3.   No results found for this or any previous visit (from the past 72 hour(s)).  No results found.   ASSESSMENT/PLAN:   1. Annual physical exam Checking CBC with differential, CMP, and lipid panel today. - CBC with Differential/Platelet - COMPLETE METABOLIC PANEL WITH GFR - Lipid panel  2. Non-intractable vomiting with nausea, unspecified vomiting type Unclear etiology.  This does seem to have a cyclical nature happening approximately once per month but does not seem to have any other associated symptoms.  Zofran 8 mg ODT every 8 hours as needed for nausea.  Advised patient to monitor for hematemesis or bowel changes as well as potential symptoms that may be correlated.  3. Family history of thyroid disease 4. Thyroid disorder screen Checking TSH today. - TSH   Orders Placed This Encounter  Procedures   CBC with Differential/Platelet   COMPLETE METABOLIC PANEL WITH GFR   Lipid panel   TSH    Meds ordered this encounter  Medications   ondansetron (ZOFRAN-ODT) 8 MG disintegrating tablet    Sig: Take 1 tablet (8 mg  total) by mouth every 8 (eight) hours as needed for nausea.    Dispense:  20 tablet    Refill:  3    Order Specific Question:   Supervising Provider    Answer:   Emeterio Reeve [[id number]]    Patient Instructions  Preventive Care 14-18 Years Old, Female Preventive care refers to lifestyle choices and visits with your health care provider that can promote health and wellness. This includes: A yearly physical exam. This is also called an annual wellness visit. Regular dental and eye exams. Immunizations. Screening for certain conditions. Healthy lifestyle choices, such as: Eating a healthy diet. Getting regular exercise. Not using drugs or products that contain nicotine and tobacco. Limiting alcohol use. What can I expect for my preventive care visit? Physical exam Your health care provider may check your: Height and weight. These may be used to calculate your BMI (body mass index). BMI is a measurement that tells if you are at a healthy weight. Heart rate and blood pressure. Body temperature. Skin for abnormal spots. Counseling Your health care provider may ask you questions about your: Past medical problems. Family's medical history. Alcohol, tobacco, and drug use. Emotional well-being. Home life and relationship well-being. Sexual activity. Diet, exercise, and  sleep habits. Work and work Statistician. Access to firearms. Method of birth control. Menstrual cycle. Pregnancy history. What immunizations do I need?  Vaccines are usually given at various ages, according to a schedule. Your health care provider will recommend vaccines for you based on your age, medicalhistory, and lifestyle or other factors, such as travel or where you work. What tests do I need?  Blood tests Lipid and cholesterol levels. These may be checked every 5 years starting at age 65. Hepatitis C test. Hepatitis B test. Screening Diabetes screening. This is done by checking your blood sugar  (glucose) after you have not eaten for a while (fasting). STD (sexually transmitted disease) testing, if you are at risk. BRCA-related cancer screening. This may be done if you have a family history of breast, ovarian, tubal, or peritoneal cancers. Pelvic exam and Pap test. This may be done every 3 years starting at age 61. Starting at age 61, this may be done every 5 years if you have a Pap test in combination with an HPV test. Talk with your health care provider about your test results, treatment options,and if necessary, the need for more tests. Follow these instructions at home: Eating and drinking  Eat a healthy diet that includes fresh fruits and vegetables, whole grains, lean protein, and low-fat dairy products. Take vitamin and mineral supplements as recommended by your health care provider. Do not drink alcohol if: Your health care provider tells you not to drink. You are pregnant, may be pregnant, or are planning to become pregnant. If you drink alcohol: Limit how much you have to 0-1 drink a day. Be aware of how much alcohol is in your drink. In the [place] one drink equals one 12 oz bottle of beer (355 mL), one 5 oz glass of wine (148 mL), or one 1 oz glass of hard liquor (44 mL).  Lifestyle Take daily care of your teeth and gums. Brush your teeth every morning and night with fluoride toothpaste. Floss one time each day. Stay active. Exercise for at least 30 minutes 5 or more days each week. Do not use any products that contain nicotine or tobacco, such as cigarettes, e-cigarettes, and chewing tobacco. If you need help quitting, ask your health care provider. Do not use drugs. If you are sexually active, practice safe sex. Use a condom or other form of protection to prevent STIs (sexually transmitted infections). If you do not wish to become pregnant, use a form of birth control. If you plan to become pregnant, see your health care provider for a prepregnancy visit. Find healthy  ways to cope with stress, such as: Meditation, yoga, or listening to music. Journaling. Talking to a trusted person. Spending time with friends and family. Safety Always wear your seat belt while driving or riding in a vehicle. Do not drive: If you have been drinking alcohol. Do not ride with someone who has been drinking. When you are tired or distracted. While texting. Wear a helmet and other protective equipment during sports activities. If you have firearms in your house, make sure you follow all gun safety procedures. Seek help if you have been physically or sexually abused. What's next? Go to your health care provider once a year for an annual wellness visit. Ask your health care provider how often you should have your eyes and teeth checked. Stay up to date on all vaccines. This information is not intended to replace advice given to you by your health care provider. Make sure you  discuss any questions you have with your healthcare provider. Document Revised: 05/08/2020 Document Reviewed: 05/22/2018 Elsevier Patient Education  2022 [employer].   Follow-up plan: Return in about 1 year (around 03/28/2022) for annual physical exam.  Clearnce Sorrel, DNP, APRN, FNP-BC [facility]

## 2021-03-28 NOTE — Patient Instructions (Signed)

## 2021-03-28 NOTE — Progress Notes (Signed)
   Follow-Up Visit   Subjective  Cynthia Reynolds is a 25 y.o. female who presents for the following: Alopecia (L scalp above ear, Clobetasol sol bid, pt not sure if improved) and Rosacea (Face, metronidazole gel 0.75% qd, Eucrisa oint samples prn).   The following portions of the chart were reviewed this encounter and updated as appropriate:        Review of Systems:  No other skin or systemic complaints except as noted in HPI or Assessment and Plan.  Objective  Well appearing patient in no apparent distress; mood and affect are within normal limits.  A focused examination was performed including scalp. Relevant physical exam findings are noted in the Assessment and Plan.  L parietal scalp above ear L parietal scalp above ear with good regrowth, some decreased hair density but no alopecia     L temporal scalp Pink follicular paps L temporal scalp  face Cheeks with decreased erythema   Assessment & Plan  Alopecia areata L parietal scalp above ear  Improving  Alopecia areata is a chronic autoimmune condition localized to the skin which affects hair follicles and causes hair loss, most commonly in the scalp.  Cause is unknown.  Can be unpredictable, difficult to treat, and may recur.  Treatment methods include use of topical and intralesional steroids to decrease inflammation to allow for hair regrowth.  Decrease Clobetasol sol to qd for ~2wk then decrease to 3x/wk as needed, avoid f/g/a  Folliculitis L temporal scalp  Start Clobetasol sol qd until clear  Rosacea face  Improving  Rosacea is a chronic progressive skin condition usually affecting the face of adults, causing redness and/or acne bumps. It is treatable but not curable. It sometimes affects the eyes (ocular rosacea) as well. It may respond to topical and/or systemic medication and can flare with stress, sun exposure, alcohol, exercise and some foods.  Daily application of broad spectrum spf 30+ sunscreen  to face is recommended to reduce flares.  Cont Metronidazole gel 75% qhs Cont Eucrisa oint aa face prn flares Cont SPF qd  Related Medications metroNIDAZOLE (METROGEL) 0.75 % gel Apply to the face BID.  Return if symptoms worsen or fail to improve.  I, Ardis Rowan, RMA, am acting as scribe for Willeen Niece, MD .  Documentation: I have reviewed the above documentation for accuracy and completeness, and I agree with the above.  Willeen Niece MD

## 2021-03-29 LAB — COMPLETE METABOLIC PANEL WITH GFR
AG Ratio: 1.5 (calc) (ref 1.0–2.5)
ALT: 13 U/L (ref 6–29)
AST: 17 U/L (ref 10–30)
Albumin: 4.3 g/dL (ref 3.6–5.1)
Alkaline phosphatase (APISO): 48 U/L (ref 31–125)
BUN: 8 mg/dL (ref 7–25)
CO2: 27 mmol/L (ref 20–32)
Calcium: 9.5 mg/dL (ref 8.6–10.2)
Chloride: 104 mmol/L (ref 98–110)
Creat: 0.66 mg/dL (ref 0.50–1.10)
GFR, Est African American: 141 mL/min/{1.73_m2} (ref >=60)
GFR, Est Non African American: 122 mL/min/{1.73_m2} (ref >=60)
Globulin: 2.8 g/dL (calc) (ref 1.9–3.7)
Glucose, Bld: 76 mg/dL (ref 65–99)
Potassium: 4.2 mmol/L (ref 3.5–5.3)
Sodium: 139 mmol/L (ref 135–146)
Total Bilirubin: 0.7 mg/dL (ref 0.2–1.2)
Total Protein: 7.1 g/dL (ref 6.1–8.1)

## 2021-03-29 LAB — LIPID PANEL
Cholesterol: 203 mg/dL — ABNORMAL HIGH (ref <200)
HDL: 67 mg/dL (ref >=50)
LDL Cholesterol (Calc): 118 mg/dL (calc) — ABNORMAL HIGH
Non-HDL Cholesterol (Calc): 136 mg/dL (calc) — ABNORMAL HIGH (ref <130)
Total CHOL/HDL Ratio: 3 (calc) (ref <5.0)
Triglycerides: 83 mg/dL (ref <150)

## 2021-03-29 LAB — CBC WITH DIFFERENTIAL/PLATELET
Absolute Monocytes: 230 cells/uL (ref 200–950)
Basophils Absolute: 59 cells/uL (ref 0–200)
Basophils Relative: 1.3 %
Eosinophils Absolute: 113 cells/uL (ref 15–500)
Eosinophils Relative: 2.5 %
HCT: 39.5 % (ref 35.0–45.0)
Hemoglobin: 12.9 g/dL (ref 11.7–15.5)
Lymphs Abs: 1611 cells/uL (ref 850–3900)
MCH: 31 pg (ref 27.0–33.0)
MCHC: 32.7 g/dL (ref 32.0–36.0)
MCV: 95 fL (ref 80.0–100.0)
MPV: 10.4 fL (ref 7.5–12.5)
Monocytes Relative: 5.1 %
Neutro Abs: 2489 cells/uL (ref 1500–7800)
Neutrophils Relative %: 55.3 %
Platelets: 222 10*3/uL (ref 140–400)
RBC: 4.16 10*6/uL (ref 3.80–5.10)
RDW: 11.5 % (ref 11.0–15.0)
Total Lymphocyte: 35.8 %
WBC: 4.5 10*3/uL (ref 3.8–10.8)

## 2021-08-14 ENCOUNTER — Encounter: Payer: Self-pay | Admitting: Medical-Surgical

## 2021-12-03 ENCOUNTER — Other Ambulatory Visit: Payer: Self-pay | Admitting: Medical-Surgical

## 2021-12-27 ENCOUNTER — Ambulatory Visit (INDEPENDENT_AMBULATORY_CARE_PROVIDER_SITE_OTHER): Payer: 59

## 2021-12-27 ENCOUNTER — Ambulatory Visit
Admission: RE | Admit: 2021-12-27 | Discharge: 2021-12-27 | Disposition: A | Discharge status: Home / Self Care | Payer: 59 | Source: Ambulatory Visit | Attending: Emergency Medicine | Admitting: Emergency Medicine

## 2021-12-27 VITALS — BP 96/67 | HR 102 | Temp 99.1°F | Resp 18

## 2021-12-27 DIAGNOSIS — R0602 Shortness of breath: Secondary | ICD-10-CM | POA: Diagnosis not present

## 2021-12-27 DIAGNOSIS — J029 Acute pharyngitis, unspecified: Secondary | ICD-10-CM | POA: Insufficient documentation

## 2021-12-27 DIAGNOSIS — R509 Fever, unspecified: Secondary | ICD-10-CM | POA: Diagnosis not present

## 2021-12-27 DIAGNOSIS — R051 Acute cough: Secondary | ICD-10-CM | POA: Diagnosis not present

## 2021-12-27 DIAGNOSIS — R059 Cough, unspecified: Secondary | ICD-10-CM

## 2021-12-27 DIAGNOSIS — B349 Viral infection, unspecified: Secondary | ICD-10-CM | POA: Diagnosis not present

## 2021-12-27 LAB — POCT RAPID STREP A (OFFICE): Rapid Strep A Screen: NEGATIVE

## 2021-12-27 MED ORDER — PROMETHAZINE-DM 6.25-15 MG/5ML PO SYRP: 5.0000 mL | ORAL_SOLUTION | Freq: Four times a day (QID) | ORAL | 0 refills | Status: DC | PRN

## 2021-12-27 MED ORDER — OSELTAMIVIR PHOSPHATE 75 MG PO CAPS: 75.0000 mg | ORAL_CAPSULE | Freq: Two times a day (BID) | ORAL | 0 refills | Status: AC

## 2021-12-27 NOTE — ED Provider Notes (Signed)
?[facility] ? ? ? ?CSN: [id number] ?Arrival date & time: 12/27/21  1311 ?  ? ?HISTORY  ? ?Chief Complaint  ?Patient presents with  ? Sore Throat  ?  Sore throat, cough had a fever yesterday. Took a at home Covid test yesterday and it was negative. Some lower back pain. - Entered by patient  ? Cough  ? Appointment  ? ?HPI ?Cynthia Reynolds is a 26 y.o. female. Pt c/o sore throat, nonproductive cough, and temp of 100 that began yesterday, states she had a negative home COVID test.  On arrival today, patient is a temperature of 99.1 with a pulse of 102.  Patient states the cough has been intermittent today.  Patient denies nasal congestion, sinus drainage, ear pain, difficulty swallowing.  Patient reports feeling more tired than usual and has had decreased appetite but denies nausea, vomiting or diarrhea. ? ?The history is provided by the patient.  ?Past Medical History:  ?Diagnosis Date  ? Alopecia areata   ? Anxiety   ? Depression   ? Migraine   ? ?Patient Active Problem List  ? Diagnosis Date Noted  ? Family history of thyroid disease 03/28/2021  ? Alopecia 12/08/2020  ? Anxiety   ? Migraines 12/07/2011  ? ?Past Surgical History:  ?Procedure Laterality Date  ? TYMPANOSTOMY TUBE PLACEMENT    ? ?OB History   ?No obstetric history on file. ?  ? ?Home Medications   ? ?Prior to Admission medications   ?Medication Sig Start Date End Date Taking? Authorizing Provider  ?cetirizine-pseudoephedrine (ZYRTEC-D) 5-120 MG tablet Take 1 tablet by mouth 2 (two) times daily as needed for allergies.    [provider]  ?clobetasol (TEMOVATE) 0.05 % external solution APPLY  SOLUTION TOPICALLY TWICE DAILY 12/05/21   Christen Butter, NP  ?ondansetron (ZOFRAN-ODT) 8 MG disintegrating tablet Take 1 tablet (8 mg total) by mouth every 8 (eight) hours as needed for nausea. 03/28/21   Christen Butter, NP  ?TRI-SPRINTEC 0.18/0.215/0.25 MG-35 MCG tablet Take 1 tablet by mouth daily. 10/10/20   [provider]  ? ?Family  History ?Family History  ?Problem Relation Age of Onset  ? Migraines Mother   ? Migraines Father   ? High blood pressure Maternal Grandmother   ? High blood pressure Maternal Aunt   ? Congestive Heart Failure Other   ? Hypertension Other   ? Ovarian cancer Other   ? ?Social History ?Social History  ? ?Tobacco Use  ? Smoking status: Never  ? Smokeless tobacco: Never  ?Substance Use Topics  ? Alcohol use: Yes  ?  Alcohol/week: 1.0 - 2.0 standard drink  ?  Types: 1 - 2 Standard drinks or equivalent per week  ? Drug use: No  ? ?Allergies   ?Amoxapine and related, Amoxicillin, Amoxicillin-pot clavulanate, Sulfamethoxazole-trimethoprim, Cefprozil, and Clarithromycin ? ?Review of Systems ?Review of Systems ?Pertinent findings noted in history of present illness.  ? ?Physical Exam ?Triage Vital Signs ?ED Triage Vitals  ?Enc Vitals Group  ?   BP 07/21/21 0827 (!) 147/82  ?   Pulse Rate 07/21/21 0827 72  ?   Resp 07/21/21 0827 18  ?   Temp 07/21/21 0827 98.3 ?F (36.8 ?C)  ?   Temp Source 07/21/21 0827 Oral  ?   SpO2 07/21/21 0827 98 %  ?   Weight --   ?   Height --   ?   Head Circumference --   ?   Peak Flow --   ?  Pain Score 07/21/21 0826 5  ?   Pain Loc --   ?   Pain Edu? --   ?   Excl. in GC? --   ?No data found. ? ?Updated Vital Signs ?BP 96/67 (BP Location: Right Arm)   Pulse (!) 102   Temp 99.1 ?F (37.3 ?C) (Oral)   Resp 18   LMP 11/28/2021 (Approximate)   SpO2 97%  ? ?Physical Exam ?Constitutional:   ?   General: She is not in acute distress. ?   Appearance: She is well-developed and normal weight. She is ill-appearing. She is not toxic-appearing or diaphoretic.  ?HENT:  ?   Head: Normocephalic and atraumatic.  ?   Salivary Glands: Right salivary gland is not diffusely enlarged or tender. Left salivary gland is not diffusely enlarged or tender.  ?   Right Ear: Tympanic membrane, ear canal and external ear normal.  ?   Left Ear: Tympanic membrane, ear canal and external ear normal.  ?   Nose: Congestion and  rhinorrhea present. Rhinorrhea is clear.  ?   Right Sinus: No maxillary sinus tenderness or frontal sinus tenderness.  ?   Left Sinus: No maxillary sinus tenderness.  ?   Mouth/Throat:  ?   Mouth: Mucous membranes are moist.  ?   Pharynx: Pharyngeal swelling, posterior oropharyngeal erythema and uvula swelling present.  ?   Tonsils: No tonsillar exudate. 0 on the right. 0 on the left.  ?Cardiovascular:  ?   Rate and Rhythm: Normal rate and regular rhythm.  ?   Pulses: Normal pulses.  ?Pulmonary:  ?   Effort: Pulmonary effort is normal. No tachypnea, bradypnea, accessory muscle usage, prolonged expiration, respiratory distress or retractions.  ?   Breath sounds: No stridor, decreased air movement or transmitted upper airway sounds. Examination of the right-middle field reveals decreased breath sounds. Examination of the right-lower field reveals decreased breath sounds. Decreased breath sounds present. No wheezing, rhonchi or rales.  ?Abdominal:  ?   General: Abdomen is flat. Bowel sounds are normal.  ?   Palpations: Abdomen is soft.  ?Musculoskeletal:     ?   General: Normal range of motion.  ?   Cervical back: Normal range of motion and neck supple.  ?Lymphadenopathy:  ?   Cervical: Cervical adenopathy present.  ?   Right cervical: Superficial cervical adenopathy and posterior cervical adenopathy present.  ?   Left cervical: Superficial cervical adenopathy and posterior cervical adenopathy present.  ?Skin: ?   General: Skin is warm and dry.  ?Neurological:  ?   General: No focal deficit present.  ?   Mental Status: She is alert and oriented to person, place, and time.  ?   Motor: Motor function is intact.  ?   Coordination: Coordination is intact.  ?   Gait: Gait is intact.  ?   Deep Tendon Reflexes: Reflexes are normal and symmetric.  ?Psychiatric:     ?   Attention and Perception: Attention and perception normal.     ?   Mood and Affect: Mood and affect normal.     ?   Speech: Speech normal.     ?   Behavior:  Behavior normal. Behavior is cooperative.     ?   Thought Content: Thought content normal.  ? ? ?Visual Acuity ?Right Eye Distance:   ?Left Eye Distance:   ?Bilateral Distance:   ? ?Right Eye Near:   ?Left Eye Near:    ?Bilateral Near:    ? ?  UC Couse / Diagnostics / Procedures:  ?  ?EKG ? ?Radiology ?DG Chest 2 View ? ?Result Date: 12/27/2021 ?CLINICAL DATA:  Cough, fever, and sore throat. EXAM: CHEST - 2 VIEW COMPARISON:  None. FINDINGS: The heart size and mediastinal contours are within normal limits. Both lungs are clear. The visualized skeletal structures are unremarkable. IMPRESSION: No active cardiopulmonary disease. Electronically Signed   By: Obie DredgeWilliam T Derry M.D.   On: 12/27/2021 14:50   ? ?Procedures ?Procedures (including critical care time) ? ?UC Diagnoses / Final Clinical Impressions(s)   ?I have reviewed the triage vital signs and the nursing notes. ? ?Pertinent labs & imaging results that were available during my care of the patient were reviewed by me and considered in my medical decision making (see chart for details).   ?Final diagnoses:  ?Pharyngitis, unspecified etiology  ?Viral illness  ?Acute cough  ?Acute pharyngitis, unspecified etiology  ? ?Chest x-ray is normal.  Patient tested for COVID and influenza.  Patient started empirically on Tamiflu while awaiting results of viral testing.  Patient advised of conservative care measures, supportive medications prescribed.  Return precautions advised. ? ?ED Prescriptions   ? ? Medication Sig Dispense Auth. Provider  ? oseltamivir (TAMIFLU) 75 MG capsule Take 1 capsule (75 mg total) by mouth every 12 (twelve) hours for 5 days. 10 capsule Theadora RamaMorgan, Cahlil Sattar Scales, PA-C  ? promethazine-dextromethorphan (PROMETHAZINE-DM) 6.25-15 MG/5ML syrup Take 5 mLs by mouth 4 (four) times daily as needed for cough. 180 mL Theadora RamaMorgan, Mailyn Steichen Scales, PA-C  ? ?  ? ?PDMP not reviewed this encounter. ? ?Pending results:  ?Labs Reviewed  ?CULTURE, GROUP A STREP [facility]  ?COVID-19,  FLU A+B [facility]  ?POCT RAPID STREP A (OFFICE)  ? ? ?Medications Ordered in UC: ?Medications - No data to display ? ?Disposition Upon Discharge:  ?Condition: stable for discharge home ?Home: take medications as prescribed;

## 2021-12-27 NOTE — Discharge Instructions (Addendum)
BacteriaYour symptoms and physical exam findings are concerning for a viral respiratory infection.  You were tested for both COVID and influenza today, the result of your viral testing will be posted to your MyChart once it is complete, this typically takes 24 to 48 hours.  If there is a positive result, you will be contacted by phone with further recommendations, if any.  ? ?I recommend that you begin taking Tamiflu now for empiric treatment of presumed influenza based on the history provided to me today along with my physical exam findings. Tamiflu is an antiviral medication that decreases the severity, duration and transmissibility of influenza virus by preventing the virus from reproducing itself in your body.   ?  ?If the influenza result is positive, please continue the full 5-day course of Tamiflu.  If the result is negative, please feel free to discontinue Tamiflu if you prefer but do keep in mind that Tamiflu can also be taken preventatively.  Finishing the full 5-day course will decrease the chances of catching influenza from anyone else and will not cause harm otherwise.  ? ?If your COVID-19 test is positive, please discontinue Tamiflu as it will be of no benefit to you. ?   ?Your chest x-ray is normal today.  There is no concern for pneumonia or acute bronchitis. ?  ?Please see the list below for recommended medications, dosages and frequencies to provide relief of your current symptoms:   ?  ?Ibuprofen  (Advil, Motrin): This is a good anti-inflammatory medication which addresses aches, pains and inflammation of the upper airways that causes sinus and nasal congestion as well as in the lower airways which makes your cough feel tight and sometimes burn.  I recommend that you take between 400 to 600 mg every 6-8 hours as needed.    ?  ?Acetaminophen (Tylenol): This is a good fever reducer.  If your body temperature rises above 101.5 as measured with a thermometer, it is recommended that you take 1,000 mg  every 8 hours until your temperature falls below 101.5, please not take more than 3,000 mg of acetaminophen either as a separate medication or as in ingredient in an over-the-counter cold/flu preparation within a 24-hour period.    ?  ?Guaifenesin (Robitussin, Mucinex): This is an expectorant.  This helps break up chest congestion and loosen up thick nasal drainage making phlegm and drainage more liquid and therefore easier to remove.  I recommend being 400 mg three times daily as needed.    ?  ?Promethazine DM: Promethazine is both a nasal decongestant and an antinausea medication that makes most patients feel fairly sleepy.  The DM is dextromethorphan, a cough suppressant found in many over-the-counter cough medications.  Please take 5 mL before bedtime to minimize your cough which will help you sleep better.  I have provided you with a prescription for this medication.    ?  ?Please follow-up with either your primary care provider or with urgent care for repeat evaluation you of your lungs in the next 3 to 4 days to ensure that you are improving and also to evaluate whether or not your treatment regimen needs to be adjusted. ?  ?Thank you for visiting urgent care today.  We appreciate the opportunity to participate in your care. ? ?

## 2021-12-27 NOTE — ED Triage Notes (Signed)
Pt c/o sore throat, cough, and fever yesterday. At home Covid test was negative yesterday. ?

## 2021-12-29 ENCOUNTER — Encounter: Payer: Self-pay | Admitting: Medical-Surgical

## 2021-12-29 LAB — COVID-19, FLU A+B NAA
Influenza A, NAA: NOT DETECTED
Influenza B, NAA: NOT DETECTED
SARS-CoV-2, NAA: DETECTED — AB

## 2021-12-31 LAB — CULTURE, GROUP A STREP (THRC)

## 2022-01-01 NOTE — Telephone Encounter (Signed)
All questions answered by Urgent Care.  ?

## 2022-03-29 ENCOUNTER — Other Ambulatory Visit: Payer: Self-pay | Admitting: Medical-Surgical

## 2022-03-29 ENCOUNTER — Encounter: Payer: Self-pay | Admitting: Medical-Surgical

## 2022-04-30 NOTE — Progress Notes (Unsigned)
   Established Patient Office Visit  Subjective   Patient ID: Cynthia Reynolds, female   DOB: 18-Jan-1996 Age: 26 y.o. MRN: [id number]   No chief complaint on file.   HPI Pleasant 26 year old female presenting today for follow-up on oral contraceptives.  ROS    Objective:    There were no vitals filed for this visit.  Physical Exam   No results found for this or any previous visit (from the past 24 hour(s)).   {Labs (Optional):23779}  The ASCVD Risk score (Arnett DK, et al., 2019) failed to calculate for the following reasons:   The 2019 ASCVD risk score is only valid for ages 19 to 34   Assessment & Plan:   No problem-specific Assessment & Plan notes found for this encounter.   No follow-ups on file.  ___________________________________________ Thayer Ohm, DNP, APRN, FNP-BC Primary Care and Sports Medicine [facility]

## 2022-05-01 ENCOUNTER — Encounter: Payer: Self-pay | Admitting: Medical-Surgical

## 2022-05-01 ENCOUNTER — Ambulatory Visit (INDEPENDENT_AMBULATORY_CARE_PROVIDER_SITE_OTHER): Payer: Commercial Managed Care - HMO | Admitting: Medical-Surgical

## 2022-05-01 VITALS — BP 96/61 | HR 70 | Resp 20 | Ht 65.0 in | Wt 113.6 lb

## 2022-05-01 DIAGNOSIS — T7840XA Allergy, unspecified, initial encounter: Secondary | ICD-10-CM

## 2022-05-01 DIAGNOSIS — Z3041 Encounter for surveillance of contraceptive pills: Secondary | ICD-10-CM

## 2022-05-01 MED ORDER — LEVOCETIRIZINE DIHYDROCHLORIDE 5 MG PO TABS: 5.0000 mg | ORAL_TABLET | Freq: Every evening | ORAL | 1 refills | Status: DC

## 2022-05-01 MED ORDER — FLUTICASONE PROPIONATE 50 MCG/ACT NA SUSP: 2.0000 | Freq: Every day | NASAL | 6 refills | Status: DC

## 2022-05-01 MED ORDER — ONDANSETRON 8 MG PO TBDP: 8.0000 mg | ORAL_TABLET | Freq: Three times a day (TID) | ORAL | 3 refills | Status: DC | PRN

## 2022-05-01 MED ORDER — TRI-SPRINTEC 0.18/0.215/0.25 MG-35 MCG PO TABS: 1.0000 | ORAL_TABLET | Freq: Every day | ORAL | 3 refills | Status: DC

## 2022-05-04 ENCOUNTER — Encounter: Payer: Self-pay | Admitting: Medical-Surgical

## 2022-07-01 ENCOUNTER — Other Ambulatory Visit: Payer: Self-pay | Admitting: Medical-Surgical

## 2022-07-01 DIAGNOSIS — T7840XA Allergy, unspecified, initial encounter: Secondary | ICD-10-CM

## 2022-08-01 ENCOUNTER — Other Ambulatory Visit: Payer: Self-pay | Admitting: Medical-Surgical

## 2022-08-01 DIAGNOSIS — T7840XA Allergy, unspecified, initial encounter: Secondary | ICD-10-CM

## 2022-08-26 ENCOUNTER — Other Ambulatory Visit: Payer: Self-pay | Admitting: Medical-Surgical

## 2022-08-26 DIAGNOSIS — T7840XA Allergy, unspecified, initial encounter: Secondary | ICD-10-CM

## 2022-09-25 ENCOUNTER — Other Ambulatory Visit: Payer: Self-pay | Admitting: Medical-Surgical

## 2022-09-25 DIAGNOSIS — T7840XA Allergy, unspecified, initial encounter: Secondary | ICD-10-CM

## 2022-10-09 ENCOUNTER — Encounter: Payer: Self-pay | Admitting: Medical-Surgical

## 2022-10-11 NOTE — Telephone Encounter (Signed)
Left voicemail for patient to call back to schedule a virtual appointment.

## 2022-10-24 ENCOUNTER — Other Ambulatory Visit: Payer: Self-pay | Admitting: Medical-Surgical

## 2022-10-24 DIAGNOSIS — T7840XA Allergy, unspecified, initial encounter: Secondary | ICD-10-CM

## 2022-11-21 ENCOUNTER — Other Ambulatory Visit: Payer: Self-pay | Admitting: Medical-Surgical

## 2022-11-21 DIAGNOSIS — T7840XA Allergy, unspecified, initial encounter: Secondary | ICD-10-CM

## 2022-12-12 ENCOUNTER — Other Ambulatory Visit: Payer: Self-pay | Admitting: Medical-Surgical

## 2022-12-12 DIAGNOSIS — T7840XA Allergy, unspecified, initial encounter: Secondary | ICD-10-CM

## 2023-01-10 ENCOUNTER — Other Ambulatory Visit: Payer: Self-pay | Admitting: Medical-Surgical

## 2023-01-10 DIAGNOSIS — T7840XA Allergy, unspecified, initial encounter: Secondary | ICD-10-CM

## 2023-03-05 ENCOUNTER — Inpatient Hospital Stay: Admission: RE | Admit: 2023-03-05 | Payer: Self-pay | Source: Ambulatory Visit

## 2023-03-05 ENCOUNTER — Ambulatory Visit
Admission: EM | Admit: 2023-03-05 | Discharge: 2023-03-05 | Disposition: A | Discharge status: Home / Self Care | Payer: 59 | Attending: Nurse Practitioner | Admitting: Nurse Practitioner

## 2023-03-05 DIAGNOSIS — J069 Acute upper respiratory infection, unspecified: Secondary | ICD-10-CM | POA: Diagnosis not present

## 2023-03-05 MED ORDER — PROMETHAZINE-DM 6.25-15 MG/5ML PO SYRP: 5.0000 mL | ORAL_SOLUTION | Freq: Four times a day (QID) | ORAL | 0 refills | Status: DC | PRN

## 2023-03-05 MED ORDER — NAPROXEN 375 MG PO TABS: 375.0000 mg | ORAL_TABLET | Freq: Two times a day (BID) | ORAL | 0 refills | Status: AC | PRN

## 2023-03-05 NOTE — ED Provider Notes (Signed)
[facility]    CSN: [id number] Arrival date & time: 03/05/23  1706      History   Chief Complaint No chief complaint on file.   HPI Cynthia Reynolds is a 27 y.o. female  presents for evaluation of URI symptoms for 4 days. Patient reports associated symptoms of cough, congestion, headaches, chest wall pain with coughing. Denies N/V/D, fevers, sore throat, ear pain, body aches, shortness of breath. Patient does not have a hx of asthma or smoking.  Parents have similar symptoms.  Pt has taken Mucinex and NyQuil OTC for symptoms. Pt has no other concerns at this time.   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      Home Medications    Prior to Admission medications   Medication Sig Start Date End Date Taking? Authorizing Provider  naproxen (NAPROSYN) 375 MG tablet Take 1 tablet (375 mg total) by mouth 2 (two) times daily as needed for mild pain or moderate pain. 03/05/23  Yes Radford Pax, NP  promethazine-dextromethorphan (PROMETHAZINE-DM) 6.25-15 MG/5ML syrup Take 5 mLs by mouth 4 (four) times daily as needed for cough. 03/05/23  Yes Radford Pax, NP  cetirizine-pseudoephedrine (ZYRTEC-D) 5-120 MG tablet Take 1 tablet by mouth 2 (two) times daily as needed for allergies.    [provider]  clobetasol (TEMOVATE) 0.05 % external solution APPLY  SOLUTION TOPICALLY TWICE DAILY 12/05/21   Christen Butter, NP  fluticasone (FLONASE) 50 MCG/ACT nasal spray Place 2 sprays into both nostrils  daily. 05/01/22   Christen Butter, NP  levocetirizine (XYZAL) 5 MG tablet TAKE 1 TABLET BY MOUTH ONCE DAILY IN THE EVENING 01/10/23   Christen Butter, NP  ondansetron (ZOFRAN-ODT) 8 MG disintegrating tablet Take 1 tablet (8 mg total) by mouth every 8 (eight) hours as needed for nausea. 05/01/22   Christen Butter, NP  TRI-SPRINTEC 0.18/0.215/0.25 MG-35 MCG tablet Take 1 tablet by mouth daily. 05/01/22   Christen Butter, NP    Family History Family History  Problem Relation Age of Onset   Migraines Mother    Migraines Father    High blood pressure Maternal Grandmother    High blood pressure Maternal Aunt    Congestive Heart Failure Other    Hypertension Other    Ovarian cancer Other     Social History Social History   Tobacco Use   Smoking status: Never   Smokeless tobacco: Never  Substance Use Topics   Alcohol use: Yes    Alcohol/week: 1.0 - 2.0 standard drink of alcohol    Types: 1 - 2 Standard drinks or equivalent per week   Drug use: No     Allergies   Amoxapine and related, Amoxicillin, Amoxicillin-pot clavulanate, Sulfamethoxazole-trimethoprim, Cefprozil, and Clarithromycin   Review of Systems Review of Systems  HENT:  Positive for congestion.   Respiratory:  Positive for cough.        Chest wall pain with coughing  Neurological:  Positive for headaches.     Physical Exam Triage Vital Signs ED Triage Vitals  Enc Vitals Group     BP 03/05/23 1717 95/65     Pulse Rate 03/05/23 1716 99     Resp 03/05/23 1716 18     Temp 03/05/23 1716 98.8 F (37.1 C)     Temp Source 03/05/23 1716 Oral     SpO2 03/05/23 1716 95 %     Weight --      Height --      Head Circumference --      Peak Flow --      Pain Score 03/05/23 1716 3     Pain Loc --      Pain Edu? --      Excl. in GC? --    No data found.  Updated Vital Signs BP 95/65   Pulse 99   Temp 98.8 F (37.1 C) (Oral)   Resp 18   LMP 03/04/2023 (Exact Date)   SpO2 95%   Visual Acuity Right Eye Distance:   Left Eye  Distance:   Bilateral Distance:    Right Eye Near:   Left Eye Near:    Bilateral Near:     Physical Exam Vitals and nursing note reviewed.  Constitutional:      General: She is not in acute distress.    Appearance: She is well-developed. She is not ill-appearing.  HENT:     Head: Normocephalic and atraumatic.     Right Ear: Tympanic membrane and ear canal normal.     Left Ear: Tympanic membrane and ear canal normal.     Nose: Congestion present.     Mouth/Throat:     Mouth: Mucous membranes are moist.     Pharynx: Oropharynx is clear. Uvula midline. No oropharyngeal exudate or posterior oropharyngeal erythema.     Tonsils: No tonsillar exudate or tonsillar abscesses.  Eyes:     Conjunctiva/sclera: Conjunctivae normal.     Pupils: Pupils are equal, round, and reactive to light.  Cardiovascular:     Rate and Rhythm: Normal rate and regular rhythm.     Heart sounds: Normal heart sounds.  Pulmonary:     Effort: Pulmonary effort is normal. No respiratory distress.     Breath sounds: Normal breath sounds. No stridor. No wheezing or rhonchi.  Chest:     Chest wall: Tenderness present.  Musculoskeletal:     Cervical back: Normal range of motion and neck supple.  Lymphadenopathy:     Cervical: No cervical adenopathy.  Skin:    General: Skin is warm and dry.  Neurological:     General: No focal deficit present.     Mental Status: She is alert and oriented to person, place, and time.  Psychiatric:        Mood and Affect: Mood normal.        Behavior: Behavior normal.      UC Treatments / Results  Labs (all labs ordered are listed, but only abnormal results are displayed) Labs Reviewed - No data to display  EKG   Radiology No results found.  Procedures Procedures (including critical care time)  Medications Ordered in UC Medications - No data to display  Initial Impression / Assessment and Plan / UC Course  I have reviewed the triage vital signs and the nursing  notes.  Pertinent labs & imaging results that were available during my care of the patient were reviewed by me and considered in my  medical decision making (see chart for details).     Reviewed exam and symptoms with patient.  No red flags.  Declined COVID testing Discussed viral illness and symptomatic treatment.  Promethazine DM as needed for cough.  Side effect profile reviewed.  Naproxen twice daily as needed for chest wall pain with coughing Rest and fluids PCP follow-up if symptoms do not improve ER precautions reviewed and patient verbalized understanding Final Clinical Impressions(s) / UC Diagnoses   Final diagnoses:  Viral upper respiratory illness     Discharge Instructions      Your symptoms and exam are consistent for a viral illness.  You may take Promethazine DM as needed for your cough.  Please note this medication can make you drowsy.  Do not drink alcohol or drive while on this medication.  You may also take naproxen twice daily as needed for chest wall pain that occurs when you cough.  Viral illnesses can last 7-14 days. Please follow up with your PCP if your symptoms are not improving. Please go to the ER for any worsening symptoms. This includes but is not limited to fever you can not control with tylenol or ibuprofen, you are not able to stay hydrated, you have shortness of breath or chest pain.  Thank you for choosing [facility] for your healthcare needs. I hope you feel better soon!    ED Prescriptions     Medication Sig Dispense Auth. Provider   promethazine-dextromethorphan (PROMETHAZINE-DM) 6.25-15 MG/5ML syrup Take 5 mLs by mouth 4 (four) times daily as needed for cough. 118 mL Radford Pax, NP   naproxen (NAPROSYN) 375 MG tablet Take 1 tablet (375 mg total) by mouth 2 (two) times daily as needed for mild pain or moderate pain. 10 tablet Radford Pax, NP      PDMP not reviewed this encounter.   Radford Pax, NP 03/05/23 1728

## 2023-03-05 NOTE — Discharge Instructions (Signed)
Your symptoms and exam are consistent for a viral illness.  You may take Promethazine DM as needed for your cough.  Please note this medication can make you drowsy.  Do not drink alcohol or drive while on this medication.  You may also take naproxen twice daily as needed for chest wall pain that occurs when you cough.  Viral illnesses can last 7-14 days. Please follow up with your PCP if your symptoms are not improving. Please go to the ER for any worsening symptoms. This includes but is not limited to fever you can not control with tylenol or ibuprofen, you are not able to stay hydrated, you have shortness of breath or chest pain.  Thank you for choosing [facility] for your healthcare needs. I hope you feel better soon!

## 2023-03-05 NOTE — ED Triage Notes (Signed)
Pt presents with c/o headaches, chest tightness, cough X 4 days.  Denies fever, V/D  Home interventions; mucinex DM and Nyquil

## 2023-03-06 ENCOUNTER — Ambulatory Visit: Payer: Self-pay | Admitting: Family Medicine

## 2023-03-18 ENCOUNTER — Encounter: Payer: Self-pay | Admitting: Medical-Surgical

## 2023-03-18 ENCOUNTER — Ambulatory Visit: Payer: 59 | Admitting: Medical-Surgical

## 2023-03-18 ENCOUNTER — Ambulatory Visit (INDEPENDENT_AMBULATORY_CARE_PROVIDER_SITE_OTHER): Payer: 59

## 2023-03-18 VITALS — BP 103/63 | HR 83 | Resp 20 | Ht 65.0 in | Wt 116.3 lb

## 2023-03-18 DIAGNOSIS — T7840XA Allergy, unspecified, initial encounter: Secondary | ICD-10-CM | POA: Diagnosis not present

## 2023-03-18 DIAGNOSIS — R079 Chest pain, unspecified: Secondary | ICD-10-CM

## 2023-03-18 DIAGNOSIS — Z3041 Encounter for surveillance of contraceptive pills: Secondary | ICD-10-CM

## 2023-03-18 DIAGNOSIS — Z01419 Encounter for gynecological examination (general) (routine) without abnormal findings: Secondary | ICD-10-CM | POA: Diagnosis not present

## 2023-03-18 MED ORDER — TRI-SPRINTEC 0.18/0.215/0.25 MG-35 MCG PO TABS: 1.0000 | ORAL_TABLET | Freq: Every day | ORAL | 3 refills | Status: DC

## 2023-03-18 MED ORDER — LEVOCETIRIZINE DIHYDROCHLORIDE 5 MG PO TABS: 5.0000 mg | ORAL_TABLET | Freq: Every evening | ORAL | 3 refills | Status: DC

## 2023-03-18 NOTE — Progress Notes (Signed)
        Established patient visit  History, exam, impression, and plan:  1. Encounter for surveillance of contraceptive pills Very pleasant 27 year old female presenting today for follow-up on oral contraceptives.  She has been on Tri-Sprintec 1 tablet daily for 9-10 years.  Tolerating the medication well although she does have a few days of nausea when restarting a new pack.  She takes the inactive pills to have a monthly menstrual cycle.  She is currently sexually active with 1 female partner and reports condom use with all encounters.  Denies concern for pregnancy or STIs.  Compliant with medication dosing and denies missed pills.  No unusual headaches, vision changes, abdominal pain, shortness of breath, or lower extremity swelling/redness/tenderness.  Continue Tri-Sprintec daily as prescribed. - TRI-SPRINTEC 0.18/0.215/0.25 MG-35 MCG tablet; Take 1 tablet by mouth daily.  Dispense: 84 tablet; Refill: 3  2. Allergy, initial encounter Has done well when switching to Xyzal 5 mg daily.  No side effects and reports this works better than her previous treatment.  Refilling today. - levocetirizine (XYZAL) 5 MG tablet; Take 1 tablet (5 mg total) by mouth every evening.  Dispense: 90 tablet; Refill: 3  3. Well woman exam Reviewed recommendations for updating Pap smear.  She previously saw someone at [facility] for this.  She is interested in seeing OB/GYN.  Referral placed. - Ambulatory referral to Obstetrics / Gynecology  4. Chest pain, unspecified type Has had approximately 3-4 months of unusual chest pain that is described as sharp, stabbing, midsternal.  Has happened during the day as well as night but seems to happen more often at night.  Pain is not reproducible on palpation of the sternum or xiphoid process.  At night, notes that the pain can last 30-40 seconds and is accompanied by a feeling of being unable to catch her breath.  During the day, the symptoms resolve within 15-20 seconds on average.   No prior personal history of heart concerns.  Notes that rolling over onto her right side makes the pain worse while rolling to her left side makes it better.  Has considered GERD but has been unable to reproduce with intentional consumption of foods that should trigger it.  On exam, HRR, S1/S2 normal.  No peripheral edema.  Lungs CTA, respirations even and unlabored.  Twelve-lead EKG completed in office today, rate 73, normal sinus rhythm with normal axis.  Plan for labs as below.  Chest x-ray today. - CBC - COMPLETE METABOLIC PANEL WITH GFR - EKG; Future - TSH - Magnesium - DG Chest 2 View; Future  Procedures performed this visit: None.  Return in about 1 year (around 03/17/2024) for Birth control follow-up.  __________________________________ Thayer Ohm, DNP, APRN, FNP-BC Primary Care and Sports Medicine [facility] [place]

## 2023-03-18 NOTE — Patient Instructions (Signed)
Over-the-counter: Ashwaghanda Valerian root Kava St. John's wort (lots of drug interactions)  Prescription: Hydroxyzine BuSpar

## 2023-03-18 NOTE — Addendum Note (Signed)
Addended by: Chalmers Cater on: 03/18/2023 03:02 PM   Modules accepted: Orders

## 2023-03-19 ENCOUNTER — Encounter: Payer: Self-pay | Admitting: Medical-Surgical

## 2023-03-19 LAB — CBC
HCT: 36.9 % (ref 35.0–45.0)
Hemoglobin: 12.3 g/dL (ref 11.7–15.5)
MCH: 31.1 pg (ref 27.0–33.0)
MCHC: 33.3 g/dL (ref 32.0–36.0)
MCV: 93.4 fL (ref 80.0–100.0)
MPV: 10.3 fL (ref 7.5–12.5)
Platelets: 325 10*3/uL (ref 140–400)
RBC: 3.95 10*6/uL (ref 3.80–5.10)
RDW: 11.3 % (ref 11.0–15.0)
WBC: 6.6 10*3/uL (ref 3.8–10.8)

## 2023-03-19 LAB — COMPLETE METABOLIC PANEL WITH GFR
AG Ratio: 1.6 (calc) (ref 1.0–2.5)
ALT: 15 U/L (ref 6–29)
AST: 19 U/L (ref 10–30)
Albumin: 4.4 g/dL (ref 3.6–5.1)
Alkaline phosphatase (APISO): 56 U/L (ref 31–125)
BUN: 7 mg/dL (ref 7–25)
CO2: 27 mmol/L (ref 20–32)
Calcium: 9.7 mg/dL (ref 8.6–10.2)
Chloride: 104 mmol/L (ref 98–110)
Creat: 0.68 mg/dL (ref 0.50–0.96)
Globulin: 2.8 g/dL (calc) (ref 1.9–3.7)
Glucose, Bld: 86 mg/dL (ref 65–99)
Potassium: 4.7 mmol/L (ref 3.5–5.3)
Sodium: 139 mmol/L (ref 135–146)
Total Bilirubin: 0.3 mg/dL (ref 0.2–1.2)
Total Protein: 7.2 g/dL (ref 6.1–8.1)
eGFR: 122 mL/min/{1.73_m2} (ref >=60)

## 2023-03-19 LAB — MAGNESIUM: Magnesium: 2.1 mg/dL (ref 1.5–2.5)

## 2023-03-19 LAB — TSH: TSH: 2.28 mIU/L

## 2023-03-19 MED ORDER — FAMOTIDINE 20 MG PO TABS: 20.0000 mg | ORAL_TABLET | Freq: Two times a day (BID) | ORAL | 1 refills | Status: DC

## 2023-04-06 ENCOUNTER — Encounter: Payer: Self-pay | Admitting: Dermatology

## 2023-04-10 ENCOUNTER — Ambulatory Visit: Payer: 59 | Admitting: Dermatology

## 2023-05-07 ENCOUNTER — Ambulatory Visit: Payer: Commercial Managed Care - HMO | Admitting: Medical-Surgical

## 2023-05-11 ENCOUNTER — Other Ambulatory Visit: Payer: Self-pay | Admitting: Medical-Surgical

## 2023-06-10 ENCOUNTER — Other Ambulatory Visit: Payer: Self-pay | Admitting: Medical-Surgical

## 2023-06-13 ENCOUNTER — Encounter: Payer: Self-pay | Admitting: Medical-Surgical

## 2023-06-30 DIAGNOSIS — H6693 Otitis media, unspecified, bilateral: Secondary | ICD-10-CM | POA: Diagnosis not present

## 2023-06-30 DIAGNOSIS — J069 Acute upper respiratory infection, unspecified: Secondary | ICD-10-CM | POA: Diagnosis not present

## 2023-06-30 DIAGNOSIS — H7292 Unspecified perforation of tympanic membrane, left ear: Secondary | ICD-10-CM | POA: Diagnosis not present

## 2023-07-04 ENCOUNTER — Other Ambulatory Visit: Payer: Self-pay | Admitting: Medical-Surgical

## 2023-09-03 IMAGING — DX DG CHEST 2V
2 series · 2 of 2 positions shown · non-contrast
Comparison: None.

CLINICAL DATA: Cough, fever, and sore throat.

EXAM:
CHEST - 2 VIEW

[chest pa]
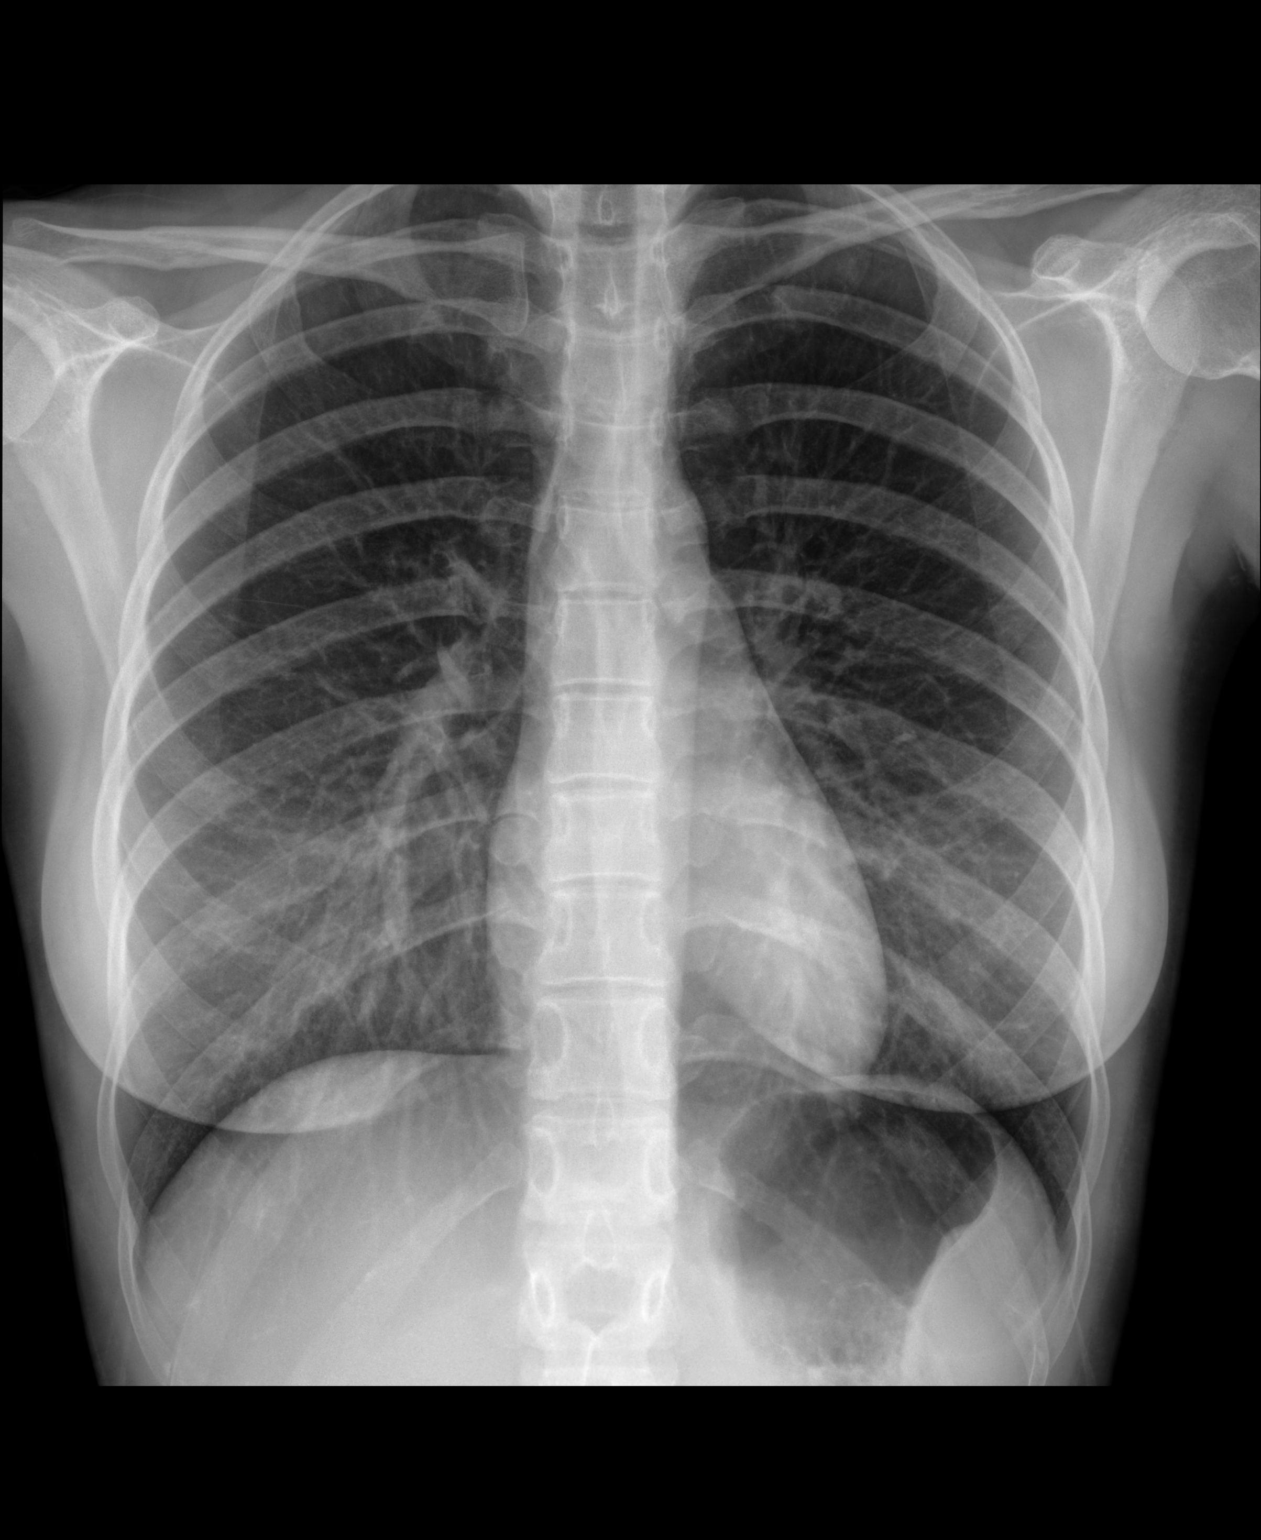

[chest lat]
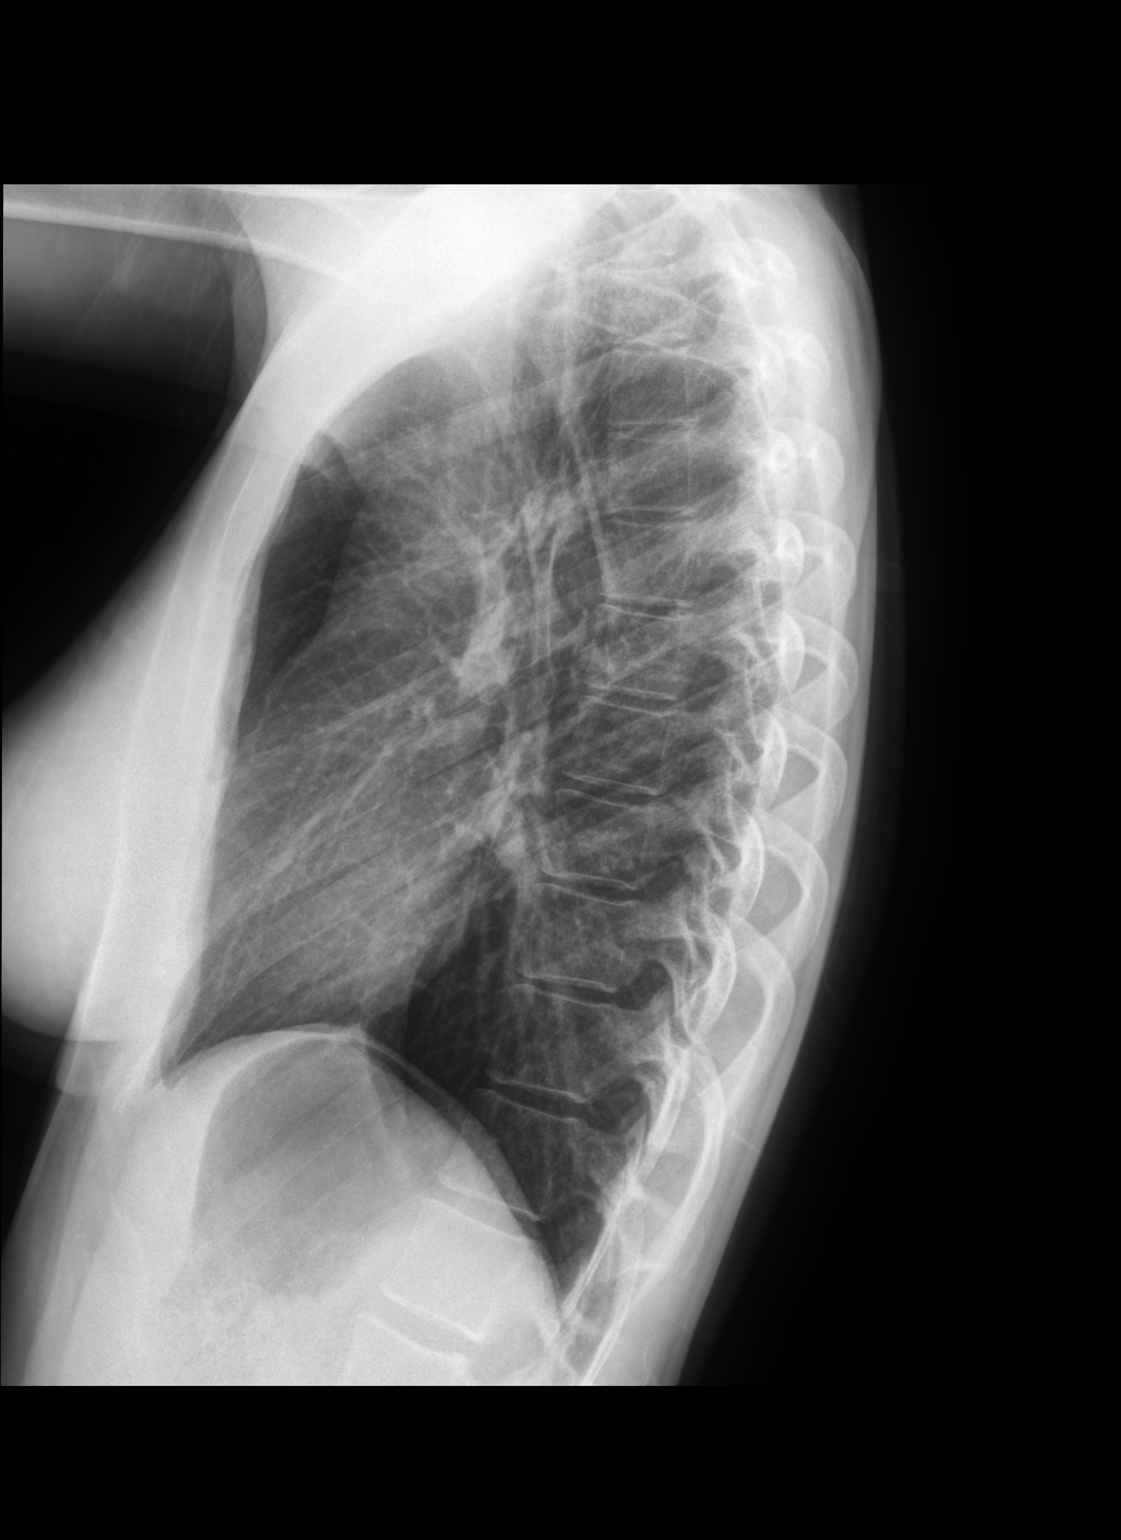

[2 of 2 positions shown; findings below may reference images not displayed]

FINDINGS: The heart size and mediastinal contours are within normal limits.
Both lungs are clear. The visualized skeletal structures are
unremarkable.
IMPRESSION: No active cardiopulmonary disease.

## 2024-01-19 ENCOUNTER — Encounter: Payer: Self-pay | Admitting: Medical-Surgical

## 2024-01-22 ENCOUNTER — Encounter: Payer: Self-pay | Admitting: Medical-Surgical

## 2024-01-22 ENCOUNTER — Ambulatory Visit (INDEPENDENT_AMBULATORY_CARE_PROVIDER_SITE_OTHER): Payer: Self-pay | Admitting: Medical-Surgical

## 2024-01-22 VITALS — BP 91/61 | HR 83 | Resp 20 | Ht 65.0 in | Wt 120.2 lb

## 2024-01-22 DIAGNOSIS — H6502 Acute serous otitis media, left ear: Secondary | ICD-10-CM

## 2024-01-22 MED ORDER — DOXYCYCLINE HYCLATE 100 MG PO TABS: 100.0000 mg | ORAL_TABLET | Freq: Two times a day (BID) | ORAL | 0 refills | Status: DC

## 2024-01-22 NOTE — Progress Notes (Signed)
        Established patient visit  History, exam, impression, and plan:  1. Non-recurrent acute serous otitis media of left ear (Primary) Pleasant 28 year old female presenting today with complaints of left ear pressure, pain, and muffled hearing. Her symptoms occur mostly at night. Pain is described as burning like there is heated air inside her ear. Denies drainage, fever, chills, and sinus symptoms. No recent illnesses. 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 On exam, her ear canals are patent bilaterally with mild erythema. The R TM is patent with scarring, cone of light intact. The L TM is patent with slightly cloudy yellow fluid visible, no bulging. No cervical lymphadenopathy. Suspect eustachian tube dysfunction that is evolving into left otitis media. Treating with  Doxycycline due to multiple antibiotic allergies/intolerances. Start Flonase  2 sprays per nare daily. Reviewed correct administration technique. Take a daily antihistamine for management of allergies.   Procedures performed this visit: None.  Return if symptoms worsen or fail to improve.  __________________________________ Maryl Snook, DNP, APRN, FNP-BC Primary Care and Sports Medicine [facility] [place]

## 2024-01-27 ENCOUNTER — Ambulatory Visit: Payer: Self-pay | Admitting: Medical-Surgical

## 2024-03-05 ENCOUNTER — Encounter: Payer: Self-pay | Admitting: Medical-Surgical

## 2024-03-05 NOTE — Telephone Encounter (Signed)
 Attempted call to patient . Phone rang without answer could not leave a voice mail message.

## 2024-03-11 ENCOUNTER — Encounter: Payer: Self-pay | Admitting: Family Medicine

## 2024-03-11 ENCOUNTER — Ambulatory Visit: Admitting: Family Medicine

## 2024-03-11 VITALS — BP 93/60 | HR 72 | Ht 65.0 in | Wt 118.0 lb

## 2024-03-11 DIAGNOSIS — G43819 Other migraine, intractable, without status migrainosus: Secondary | ICD-10-CM | POA: Diagnosis not present

## 2024-03-11 MED ORDER — RIZATRIPTAN BENZOATE 10 MG PO TBDP: 10.0000 mg | ORAL_TABLET | ORAL | 3 refills | Status: AC | PRN

## 2024-03-11 MED ORDER — TOPIRAMATE 50 MG PO TABS: ORAL_TABLET | ORAL | 1 refills | Status: DC

## 2024-03-11 NOTE — Assessment & Plan Note (Signed)
 Adding topiramate and will titrate over the next couple weeks.  She is on OCP for contraception.  Also use Maxalt  as needed for breakthrough.  Recommend she continue to hydrate well and maintain adequate sleep.

## 2024-03-11 NOTE — Progress Notes (Signed)
 Cynthia Reynolds - 28 y.o. female MRN [id number]  Date of birth: 05-02-1996  Subjective Chief Complaint  Patient presents with   Headache    HPI Cynthia Reynolds is a 28 year old female here today with complaint of headaches.  She has had increasing frequency of headaches over the past year.  Headaches are typically located in the front and head.  She has had some dizziness associated with these episodes.  Thought to initially be allergy related she has tried Xyzal  lot of improvement.  She denies any neurological changes including vision changes, nausea, light or sound sensitivity.  She does have history of migraines and was seen by neurology several years ago.  Treated with amitriptyline and Maxalt  at that time.  She reports she is getting plenty of sleep and hydrating well.  ROS:  A comprehensive ROS was completed and negative except as noted per HPI  Allergies  Allergen Reactions   Amoxapine And Related Anaphylaxis   Amoxicillin Anaphylaxis and Rash    Swelling and rash   Amoxicillin-Pot Clavulanate Anaphylaxis    Swelling and rash   Sulfamethoxazole-Trimethoprim Rash        Cefprozil Diarrhea and Rash   Clarithromycin Rash    Past Medical History:  Diagnosis Date   Alopecia areata    Anxiety    Depression    Migraine     Past Surgical History:  Procedure Laterality Date   TYMPANOSTOMY TUBE PLACEMENT      Social History   Socioeconomic History   Marital status: Single    Spouse name: Not on file   Number of children: 0   Years of education: college   Highest education level: Bachelor's degree (e.g., BA, AB, BS)  Occupational History   Occupation: Dentist: UNEMPLOYED    Comment: [employer]  Tobacco Use   Smoking status: Never   Smokeless tobacco: Never  Substance and Sexual Activity   Alcohol use: Yes    Alcohol/week: 1.0 - 2.0 standard drink of alcohol    Types: 1 - 2 Standard drinks or equivalent per week   Drug use: No   Sexual activity: Yes     Birth control/protection: Pill  Other Topics Concern   Not on file  Social History Narrative   Patient lives at home with her parents. Patient goes to [employer].    Right handed   Caffeine - 2 cups   Social Drivers of Health   Financial Resource Strain: Medium Risk (03/10/2024)   Overall Financial Resource Strain (CARDIA)    Difficulty of Paying Living Expenses: Somewhat hard  Food Insecurity: No Food Insecurity (03/10/2024)   Hunger Vital Sign    Worried About Running Out of Food in the Last Year: Never true    Ran Out of Food in the Last Year: Never true  Transportation Needs: No Transportation Needs (03/10/2024)   PRAPARE - Administrator, Civil Service (Medical): No    Lack of Transportation (Non-Medical): No  Physical Activity: Insufficiently Active (03/10/2024)   Exercise Vital Sign    Days of Exercise per Week: 3 days    Minutes of Exercise per Session: 30 min  Stress: Stress Concern Present (03/10/2024)   [employer] of Occupational Health - Occupational Stress Questionnaire    Feeling of Stress: To some extent  Social Connections: Socially Isolated (03/10/2024)   Social Connection and Isolation Panel    Frequency of Communication with Friends and Family: Twice a week    Frequency of  Social Gatherings with Friends and Family: Twice a week    Attends Religious Services: Never    Database administrator or Organizations: No    Attends Engineer, structural: Not on file    Marital Status: Never married    Family History  Problem Relation Age of Onset   Migraines Mother    Migraines Father    High blood pressure Maternal Grandmother    High blood pressure Maternal Aunt    Congestive Heart Failure Other    Hypertension Other    Ovarian cancer Other     Health Maintenance  Topic Date Due   Cervical Cancer Screening (Pap smear)  10/05/2022   COVID-19 Vaccine (4 - 2024-25 season) 05/26/2023   Hepatitis C Screening  05/01/2032 (Originally  04/13/2012)   HIV Screening  05/01/2032 (Originally 04/13/2009)   INFLUENZA VACCINE  04/24/2024   DTaP/Tdap/Td (8 - Td or Tdap) 03/02/2027   HPV VACCINES  Completed   Meningococcal B Vaccine  Aged Out     ----------------------------------------------------------------------------------------------------------------------------------------------------------------------------------------------------------------- Physical Exam BP 93/60 (BP Location: Left Arm, Patient Position: Sitting, Cuff Size: Small)   Pulse 72   Ht 5' 5 (1.651 m)   Wt 118 lb (53.5 kg)   SpO2 98%   BMI 19.64 kg/m   Physical Exam Constitutional:      Appearance: Normal appearance.  HENT:     Head: Normocephalic and atraumatic.   Eyes:     General: No scleral icterus.   Cardiovascular:     Rate and Rhythm: Normal rate.  Pulmonary:     Effort: Pulmonary effort is normal.     Breath sounds: Normal breath sounds.   Neurological:     Mental Status: She is alert.   Psychiatric:        Mood and Affect: Mood normal.        Behavior: Behavior normal.     ------------------------------------------------------------------------------------------------------------------------------------------------------------------------------------------------------------------- Assessment and Plan  Migraines Adding topiramate and will titrate over the next couple weeks.  She is on OCP for contraception.  Also use Maxalt  as needed for breakthrough.  Recommend she continue to hydrate well and maintain adequate sleep.   Meds ordered this encounter  Medications   topiramate (TOPAMAX) 50 MG tablet    Sig: One half tab p.o. nightly for 1 week then 1 tab p.o. nightly for 1 week then 1 tab p.o. twice daily    Dispense:  60 tablet    Refill:  1   rizatriptan  (MAXALT -MLT) 10 MG disintegrating tablet    Sig: Take 1 tablet (10 mg total) by mouth as needed for migraine. May repeat in 2 hours if needed    Dispense:  10 tablet     Refill:  3    Return in about 6 weeks (around 04/22/2024) for f/u headaches w/ PCP.

## 2024-03-11 NOTE — Patient Instructions (Addendum)
 Start topiramate 1/2 tab daily x7 days, 1 tab daily x7 days then 1 tab twice per day.  Use maxalt  as needed for breakthrough headaches.  Follow up in 6 weeks.

## 2024-03-18 ENCOUNTER — Encounter: Payer: Self-pay | Admitting: Family Medicine

## 2024-03-27 ENCOUNTER — Other Ambulatory Visit: Payer: Self-pay | Admitting: Medical-Surgical

## 2024-03-27 DIAGNOSIS — Z3041 Encounter for surveillance of contraceptive pills: Secondary | ICD-10-CM

## 2024-03-30 ENCOUNTER — Encounter: Payer: Self-pay | Admitting: Medical-Surgical

## 2024-03-30 DIAGNOSIS — Z3041 Encounter for surveillance of contraceptive pills: Secondary | ICD-10-CM

## 2024-03-30 MED ORDER — TRI-SPRINTEC 0.18/0.215/0.25 MG-35 MCG PO TABS: 1.0000 | ORAL_TABLET | Freq: Every day | ORAL | 0 refills | Status: DC

## 2024-03-31 ENCOUNTER — Ambulatory Visit (INDEPENDENT_AMBULATORY_CARE_PROVIDER_SITE_OTHER): Admitting: Medical-Surgical

## 2024-03-31 ENCOUNTER — Encounter: Payer: Self-pay | Admitting: Medical-Surgical

## 2024-03-31 VITALS — BP 99/61 | HR 72 | Resp 20 | Ht 65.0 in | Wt 113.8 lb

## 2024-03-31 DIAGNOSIS — Z0000 Encounter for general adult medical examination without abnormal findings: Secondary | ICD-10-CM

## 2024-03-31 DIAGNOSIS — Z3041 Encounter for surveillance of contraceptive pills: Secondary | ICD-10-CM

## 2024-03-31 DIAGNOSIS — Z13220 Encounter for screening for lipoid disorders: Secondary | ICD-10-CM | POA: Diagnosis not present

## 2024-03-31 MED ORDER — TRI-SPRINTEC 0.18/0.215/0.25 MG-35 MCG PO TABS: 1.0000 | ORAL_TABLET | Freq: Every day | ORAL | 4 refills | Status: AC

## 2024-03-31 MED ORDER — LEVOCETIRIZINE DIHYDROCHLORIDE 5 MG PO TABS: 5.0000 mg | ORAL_TABLET | Freq: Every evening | ORAL | 3 refills | Status: AC

## 2024-03-31 NOTE — Progress Notes (Unsigned)
 Complete physical exam  Patient: Cynthia Reynolds   DOB: 10-13-1995   28 y.o. Female  MRN: [id number]  Subjective:    Chief Complaint  Patient presents with   Annual Exam    Cynthia Reynolds is a 28 y.o. female who presents today for a complete physical exam. She reports consuming a ovolactovegetarian diet. The patient does not participate in regular exercise at present. She generally feels well. She reports sleeping well. She does not have additional problems to discuss today.    Most recent fall risk assessment:    05/01/2022   10:30 AM  Fall Risk   Falls in the past year? 0  Number falls in past yr: 0  Injury with Fall? 0  Risk for fall due to : No Fall Risks  Follow up Falls evaluation completed      Data saved with a previous flowsheet row definition     Most recent depression screenings:    01/22/2024   10:58 AM 05/01/2022   10:30 AM  PHQ 2/9 Scores  PHQ - 2 Score 2 2  PHQ- 9 Score 9 9    Vision:Within last year and Dental: No current dental problems and Receives regular dental care  {History (Optional):23778}  Patient Care Team: Willo Mini, NP as PCP - General (Nurse Practitioner)   Outpatient Medications Prior to Visit  Medication Sig   clobetasol  (TEMOVATE ) 0.05 % external solution APPLY  SOLUTION TOPICALLY TWICE DAILY   famotidine  (PEPCID ) 20 MG tablet Take 1 tablet by mouth twice daily   levocetirizine (XYZAL ) 5 MG tablet Take 5 mg by mouth every evening.   naproxen  (NAPROSYN ) 375 MG tablet Take 1 tablet (375 mg total) by mouth 2 (two) times daily as needed for mild pain or moderate pain.   ondansetron  (ZOFRAN -ODT) 8 MG disintegrating tablet DISSOLVE 1 TABLET IN MOUTH EVERY 8 HOURS AS NEEDED FOR NAUSEA   rizatriptan  (MAXALT -MLT) 10 MG disintegrating tablet Take 1 tablet (10 mg total) by mouth as needed for migraine. May repeat in 2 hours if needed   topiramate  (TOPAMAX ) 50 MG tablet One half tab p.o. nightly for 1 week then 1 tab p.o. nightly for 1  week then 1 tab p.o. twice daily   TRI-SPRINTEC  0.18/0.215/0.25 MG-35 MCG tablet Take 1 tablet by mouth daily.   No facility-administered medications prior to visit.    Review of Systems  Constitutional:  Negative for chills, fever, malaise/fatigue and weight loss.  HENT:  Negative for congestion, ear pain, hearing loss, sinus pain and sore throat.   Eyes:  Negative for blurred vision, photophobia and pain.  Respiratory:  Negative for cough, shortness of breath and wheezing.   Cardiovascular:  Negative for chest pain, palpitations and leg swelling.  Gastrointestinal:  Positive for heartburn. Negative for abdominal pain, constipation, diarrhea, nausea and vomiting.  Genitourinary:  Negative for dysuria, frequency and urgency.  Musculoskeletal:  Negative for falls and neck pain.  Skin:  Negative for itching and rash.  Neurological:  Positive for headaches. Negative for dizziness and weakness.  Endo/Heme/Allergies:  Positive for environmental allergies. Negative for polydipsia. Does not bruise/bleed easily.  Psychiatric/Behavioral:  Negative for depression, substance abuse and suicidal ideas. The patient is not nervous/anxious.      Objective:    BP 99/61 (BP Location: Left Arm, Cuff Size: Small)   Pulse 72   Resp 20   Ht 5' 5 (1.651 m)   Wt 113 lb 12.8 oz (51.6 kg)   SpO2 96%  BMI 18.94 kg/m  {Vitals History (Optional):23777}  Physical Exam Constitutional:      General: She is not in acute distress.    Appearance: Normal appearance. She is not ill-appearing.  HENT:     Head: Normocephalic and atraumatic.     Right Ear: Tympanic membrane, ear canal and external ear normal. There is no impacted cerumen.     Left Ear: Tympanic membrane, ear canal and external ear normal. There is no impacted cerumen.     Nose: Nose normal. No congestion or rhinorrhea.     Mouth/Throat:     Mouth: Mucous membranes are moist.     Pharynx: No oropharyngeal exudate or posterior oropharyngeal  erythema.  Eyes:     General: No scleral icterus.       Right eye: No discharge.        Left eye: No discharge.     Extraocular Movements: Extraocular movements intact.     Conjunctiva/sclera: Conjunctivae normal.     Pupils: Pupils are equal, round, and reactive to light.  Neck:     Thyroid : No thyromegaly.     Vascular: No carotid bruit or JVD.     Trachea: Trachea normal.  Cardiovascular:     Rate and Rhythm: Normal rate and regular rhythm.     Pulses: Normal pulses.     Heart sounds: Normal heart sounds. No murmur heard.    No friction rub. No gallop.  Pulmonary:     Effort: Pulmonary effort is normal. No respiratory distress.     Breath sounds: Normal breath sounds. No wheezing.  Abdominal:     General: Bowel sounds are normal. There is no distension.     Palpations: Abdomen is soft.     Tenderness: There is no abdominal tenderness. There is no guarding.  Musculoskeletal:        General: Normal range of motion.     Cervical back: Normal range of motion and neck supple.  Lymphadenopathy:     Cervical: No cervical adenopathy.  Skin:    General: Skin is warm and dry.  Neurological:     Mental Status: She is alert and oriented to person, place, and time.     Cranial Nerves: No cranial nerve deficit.  Psychiatric:        Mood and Affect: Mood normal.        Behavior: Behavior normal.        Thought Content: Thought content normal.        Judgment: Judgment normal.   No results found for any visits on 03/31/24. {Show previous labs (optional):23779}    Assessment & Plan:    Routine Health Maintenance and Physical Exam  Immunization History  Administered Date(s) Administered   DTaP 06/21/1994, 08/22/1994, 10/23/1994, 05/21/1995, 01/18/1999   HIB (PRP-T) 06/21/1994, 08/22/1994, 10/23/1994, 05/21/1995   HPV Quadrivalent 08/22/2006, 06/10/2007   Hepatitis A, Ped/Adol-2 Dose 08/22/2006, 06/10/2007   Hepatitis B, PED/ADOLESCENT 09-30-93, 05/18/1994, 10/23/1994    Influenza Split 10/12/2011   Influenza, High Dose Seasonal PF 07/22/2019   Influenza,inj,quad, With Preservative 06/26/2016   Influenza-Unspecified 08/18/2018   MMR 04/17/1995, 01/18/1999   OPV 06/21/1994, 08/22/1994, 10/23/1994, 01/18/1999   PFIZER(Purple Top)SARS-COV-2 Vaccination 11/24/2019, 12/23/2019, 08/25/2020   Tdap 08/22/2006, 03/01/2017   Varicella 04/17/1995    Health Maintenance  Topic Date Due   Cervical Cancer Screening (Pap smear)  10/05/2022   COVID-19 Vaccine (4 - 2024-25 season) 04/15/2024 (Originally 05/26/2023)   Hepatitis C Screening  05/01/2032 (Originally 04/13/2012)   HIV Screening  05/01/2032 (Originally 04/13/2009)   INFLUENZA VACCINE  04/24/2024   DTaP/Tdap/Td (8 - Td or Tdap) 03/02/2027   Hepatitis B Vaccines  Completed   HPV VACCINES  Completed   Meningococcal B Vaccine  Aged Out    Discussed health benefits of physical activity, and encouraged her to engage in regular exercise appropriate for her age and condition.     No follow-ups on file.     Alfonso Carden, NP

## 2024-04-01 ENCOUNTER — Ambulatory Visit: Payer: Self-pay | Admitting: Medical-Surgical

## 2024-04-01 LAB — CMP14+EGFR
ALT: 21 IU/L (ref 0–32)
AST: 20 IU/L (ref 0–40)
Albumin: 4.3 g/dL (ref 4.0–5.0)
Alkaline Phosphatase: 57 IU/L (ref 44–121)
BUN/Creatinine Ratio: 8 — ABNORMAL LOW (ref 9–23)
BUN: 5 mg/dL — ABNORMAL LOW (ref 6–20)
Bilirubin Total: 0.5 mg/dL (ref 0.0–1.2)
CO2: 21 mmol/L (ref 20–29)
Calcium: 9.4 mg/dL (ref 8.7–10.2)
Chloride: 101 mmol/L (ref 96–106)
Creatinine, Ser: 0.64 mg/dL (ref 0.57–1.00)
Globulin, Total: 2.2 g/dL (ref 1.5–4.5)
Glucose: 89 mg/dL (ref 70–99)
Potassium: 4 mmol/L (ref 3.5–5.2)
Sodium: 138 mmol/L (ref 134–144)
Total Protein: 6.5 g/dL (ref 6.0–8.5)
eGFR: 123 mL/min/1.73 (ref >59)

## 2024-04-01 LAB — LIPID PANEL
Chol/HDL Ratio: 3.6 ratio (ref 0.0–4.4)
Cholesterol, Total: 199 mg/dL (ref 100–199)
HDL: 55 mg/dL (ref >39)
LDL Chol Calc (NIH): 135 mg/dL — ABNORMAL HIGH (ref 0–99)
Triglycerides: 51 mg/dL (ref 0–149)
VLDL Cholesterol Cal: 9 mg/dL (ref 5–40)

## 2024-04-01 LAB — CBC
Hematocrit: 37.5 % (ref 34.0–46.6)
Hemoglobin: 12.3 g/dL (ref 11.1–15.9)
MCH: 31.2 pg (ref 26.6–33.0)
MCHC: 32.8 g/dL (ref 31.5–35.7)
MCV: 95 fL (ref 79–97)
Platelets: 245 x10E3/uL (ref 150–450)
RBC: 3.94 x10E6/uL (ref 3.77–5.28)
RDW: 11.7 % (ref 11.7–15.4)
WBC: 5.8 x10E3/uL (ref 3.4–10.8)

## 2024-04-04 ENCOUNTER — Other Ambulatory Visit: Payer: Self-pay

## 2024-04-04 ENCOUNTER — Ambulatory Visit
Admission: RE | Admit: 2024-04-04 | Discharge: 2024-04-04 | Disposition: A | Discharge status: Home / Self Care | Source: Ambulatory Visit | Attending: Family Medicine | Admitting: Family Medicine

## 2024-04-04 VITALS — BP 101/68 | HR 90 | Temp 99.3°F | Resp 18

## 2024-04-04 DIAGNOSIS — R21 Rash and other nonspecific skin eruption: Secondary | ICD-10-CM | POA: Diagnosis not present

## 2024-04-04 DIAGNOSIS — T7840XA Allergy, unspecified, initial encounter: Secondary | ICD-10-CM | POA: Diagnosis not present

## 2024-04-04 MED ORDER — PREDNISONE 50 MG PO TABS: ORAL_TABLET | ORAL | 0 refills | Status: DC

## 2024-04-04 MED ORDER — PREDNISONE 10 MG (21) PO TBPK: ORAL_TABLET | Freq: Every day | ORAL | 0 refills | Status: DC

## 2024-04-04 NOTE — ED Triage Notes (Signed)
 Pt c/o rash to face. Started on chin, then spread to cheeks.  Very itchy. Denies any new products used. Tried both oral and topical benedryl with no relief.

## 2024-04-04 NOTE — ED Provider Notes (Signed)
 [facility]    CSN: [id number] Arrival date & time: 04/04/24  1307      History   Chief Complaint Chief Complaint  Patient presents with   Rash    face    HPI Cynthia Reynolds is a 28 y.o. female.   HPI 28 year old female presents with rash of face.  Reports started on chin now spread to cheeks and is extremely pruritic.  Patient reports trying oral and topical Benadryl with no relief.  Patient denies any new contacts or products used on her face.  PMH significant for anxiety and migraines  Past Medical History:  Diagnosis Date   Alopecia areata    Anxiety    Depression    Migraine     Patient Active Problem List   Diagnosis Date Noted   Family history of thyroid  disease 03/28/2021   Alopecia 12/08/2020   Recurrent major depressive disorder, in full remission (HCC) 08/18/2018   Perforation of right tympanic membrane 10/01/2014   Anxiety    Migraines 12/07/2011    Past Surgical History:  Procedure Laterality Date   TYMPANOSTOMY TUBE PLACEMENT      OB History   No obstetric history on file.      Home Medications    Prior to Admission medications   Medication Sig Start Date End Date Taking? Authorizing Provider  predniSONE  (DELTASONE ) 50 MG tablet Take 1 tab p.o. daily for 5 days. 04/04/24  Yes Teddy Sharper, FNP  predniSONE  (STERAPRED UNI-PAK 21 TAB) 10 MG (21) TBPK tablet Take by mouth daily. Take 6 tabs by mouth daily  for 2 days, then 5 tabs for 2 days, then 4 tabs for 2 days, then 3 tabs for 2 days, 2 tabs for 2 days, then 1 tab by mouth daily for 2 days 04/04/24  Yes Teddy Sharper, FNP  clobetasol  (TEMOVATE ) 0.05 % external solution APPLY  SOLUTION TOPICALLY TWICE DAILY 12/05/21   Willo Mini, NP  famotidine  (PEPCID ) 20 MG tablet Take 1 tablet by mouth twice daily 07/04/23   Jessup, Joy, NP  levocetirizine (XYZAL ) 5 MG tablet Take 1 tablet (5 mg total) by mouth every evening. 03/31/24   Willo Mini, NP  naproxen  (NAPROSYN ) 375 MG tablet Take 1  tablet (375 mg total) by mouth 2 (two) times daily as needed for mild pain or moderate pain. 03/05/23   Mayer, Jodi R, NP  ondansetron  (ZOFRAN -ODT) 8 MG disintegrating tablet DISSOLVE 1 TABLET IN MOUTH EVERY 8 HOURS AS NEEDED FOR NAUSEA 06/14/23   Willo Mini, NP  rizatriptan  (MAXALT -MLT) 10 MG disintegrating tablet Take 1 tablet (10 mg total) by mouth as needed for migraine. May repeat in 2 hours if needed 03/11/24   Alvia Bring, DO  topiramate  (TOPAMAX ) 50 MG tablet One half tab p.o. nightly for 1 week then 1 tab p.o. nightly for 1 week then 1 tab p.o. twice daily 03/11/24   Alvia Bring, DO  TRI-SPRINTEC  0.18/0.215/0.25 MG-35 MCG tablet Take 1 tablet by mouth daily. 03/31/24   Willo Mini, NP    Family History Family History  Problem Relation Age of Onset   Migraines Mother    Migraines Father    High blood pressure Maternal Grandmother    High blood pressure Maternal Aunt    Congestive Heart Failure Other    Hypertension Other    Ovarian cancer Other     Social History Social History   Tobacco Use   Smoking status: Never   Smokeless tobacco: Never  Substance Use Topics  Alcohol use: Yes    Alcohol/week: 1.0 - 2.0 standard drink of alcohol    Types: 1 - 2 Standard drinks or equivalent per week   Drug use: No     Allergies   Amoxapine and related, Amoxicillin, Amoxicillin-pot clavulanate, Sulfamethoxazole-trimethoprim, Cefprozil, and Clarithromycin   Review of Systems Review of Systems  Skin:  Positive for rash.     Physical Exam Triage Vital Signs ED Triage Vitals  Encounter Vitals Group     BP 04/04/24 1317 101/68     Girls Systolic BP Percentile --      Girls Diastolic BP Percentile --      Boys Systolic BP Percentile --      Boys Diastolic BP Percentile --      Pulse Rate 04/04/24 1317 90     Resp 04/04/24 1317 18     Temp 04/04/24 1317 99.3 F (37.4 C)     Temp Source 04/04/24 1317 Oral     SpO2 04/04/24 1317 96 %     Weight --      Height --       Head Circumference --      Peak Flow --      Pain Score 04/04/24 1318 0     Pain Loc --      Pain Education --      Exclude from Growth Chart --    No data found.  Updated Vital Signs BP 101/68 (BP Location: Right Arm)   Pulse 90   Temp 99.3 F (37.4 C) (Oral)   Resp 18   LMP 03/19/2024 (Approximate)   SpO2 96%     Physical Exam Vitals and nursing note reviewed.  Constitutional:      General: She is not in acute distress.    Appearance: Normal appearance. She is normal weight. She is not ill-appearing.  HENT:     Head: Normocephalic and atraumatic.     Mouth/Throat:     Mouth: Mucous membranes are moist.     Pharynx: Oropharynx is clear.  Eyes:     Extraocular Movements: Extraocular movements intact.     Conjunctiva/sclera: Conjunctivae normal.     Pupils: Pupils are equal, round, and reactive to light.  Cardiovascular:     Rate and Rhythm: Normal rate and regular rhythm.     Pulses: Normal pulses.     Heart sounds: Normal heart sounds.  Pulmonary:     Effort: Pulmonary effort is normal.     Breath sounds: Normal breath sounds. No wheezing, rhonchi or rales.  Musculoskeletal:        General: Normal range of motion.     Cervical back: Normal range of motion and neck supple.  Skin:    General: Skin is warm and dry.     Comments: Face (bilateral cheeks): Pruritic erythematous eruption please see image below  Neurological:     General: No focal deficit present.     Mental Status: She is alert and oriented to person, place, and time. Mental status is at baseline.  Psychiatric:        Mood and Affect: Mood normal.        Behavior: Behavior normal.      UC Treatments / Results  Labs (all labs ordered are listed, but only abnormal results are displayed) Labs Reviewed - No data to display  EKG   Radiology No results found.  Procedures Procedures (including critical care time)  Medications Ordered in UC Medications - No data to display  Initial  Impression / Assessment and Plan / UC Course  I have reviewed the triage vital signs and the nursing notes.  Pertinent labs & imaging results that were available during my care of the patient were reviewed by me and considered in my medical decision making (see chart for details).     MDM: 1.  Rash and nonspecific skin eruption-Rx'd prednisone  50 mg tablet: Take 1 tablet daily x 5 days; 2.  Allergic reaction, initial encounter-advised OTC Allegra 180 mg fexofenadine tablet: Take 1 tablet daily x 5 days. Advised patient to take medication as directed with food to completion.  Advised patient to take OTC Allegra 180 mg fexofenadine daily for 5 days with prednisone .  Encouraged to increase daily water intake to 64 ounces per day while taking these medications.  Advised if symptoms worsen and/or unresolved please follow-up with your PCP or here for further evaluation.  Patient discharged home, hemodynamically stable Final Clinical Impressions(s) / UC Diagnoses   Final diagnoses:  Rash and nonspecific skin eruption  Allergic reaction, initial encounter     Discharge Instructions      Advised patient to take medication as directed with food to completion.  Advised patient to take OTC Allegra 180 mg fexofenadine daily for 5 days with prednisone .  Encouraged to increase daily water intake to 64 ounces per day while taking these medications.  Advised if symptoms worsen and/or unresolved please follow-up with your PCP or here for further evaluation.     ED Prescriptions     Medication Sig Dispense Auth. Provider   predniSONE  (STERAPRED UNI-PAK 21 TAB) 10 MG (21) TBPK tablet Take by mouth daily. Take 6 tabs by mouth daily  for 2 days, then 5 tabs for 2 days, then 4 tabs for 2 days, then 3 tabs for 2 days, 2 tabs for 2 days, then 1 tab by mouth daily for 2 days 42 tablet Teddy Sharper, FNP   predniSONE  (DELTASONE ) 50 MG tablet Take 1 tab p.o. daily for 5 days. 5 tablet Binh Doten, FNP       PDMP not reviewed this encounter.   Teddy Sharper, FNP 04/04/24 1348

## 2024-04-04 NOTE — Discharge Instructions (Addendum)
 Advised patient to take medication as directed with food to completion.  Advised patient to take OTC Allegra 180 mg fexofenadine daily for 5 days with prednisone .  Encouraged to increase daily water intake to 64 ounces per day while taking these medications.  Advised if symptoms worsen and/or unresolved please follow-up with your PCP or here for further evaluation.

## 2024-04-22 ENCOUNTER — Ambulatory Visit: Admitting: Medical-Surgical

## 2024-04-22 ENCOUNTER — Encounter: Payer: Self-pay | Admitting: Medical-Surgical

## 2024-04-22 VITALS — BP 92/58 | HR 85 | Resp 20 | Ht 65.0 in | Wt 114.6 lb

## 2024-04-22 DIAGNOSIS — G43819 Other migraine, intractable, without status migrainosus: Secondary | ICD-10-CM | POA: Diagnosis not present

## 2024-04-22 MED ORDER — NORTRIPTYLINE HCL 10 MG PO CAPS: 10.0000 mg | ORAL_CAPSULE | Freq: Every day | ORAL | 0 refills | Status: DC

## 2024-04-22 NOTE — Progress Notes (Signed)
        Established patient visit   History of Present Illness   Discussed the use of AI scribe software for clinical note transcription with the patient, who gave verbal consent to proceed.  History of Present Illness   Cynthia Reynolds is a 28 year old female who presents for follow up on frequent headaches.  She experienced significant dizziness and difficulty with word-finding shortly after starting Topamax , which intensified with an increased dosage. These symptoms began around the second or third day of treatment. She initially thought these symptoms were due to fatigue but later recognized them as medication side effects. During the week on Topamax , she did not experience headaches.  There is a recent change in her headache pattern with the onset of visual auras. Two weeks ago, she noticed 'little stars' in her vision, followed by a headache 15-20 minutes later. This occurred again last week.  She was prescribed Maxalt  10 mg as needed for severe headaches but has not taken it yet because she was unsure on how it should be taken and when it should be used. Inquires about combining it with over-the-counter medications like Advil or Excedrin Migraine.      Physical Exam   Physical Exam Vitals reviewed.  Constitutional:      General: She is not in acute distress.    Appearance: Normal appearance.  HENT:     Head: Normocephalic and atraumatic.  Cardiovascular:     Rate and Rhythm: Normal rate and regular rhythm.     Pulses: Normal pulses.     Heart sounds: Normal heart sounds. No murmur heard.    No friction rub. No gallop.  Pulmonary:     Effort: Pulmonary effort is normal. No respiratory distress.     Breath sounds: Normal breath sounds. No wheezing.  Skin:    General: Skin is warm and dry.  Neurological:     Mental Status: She is alert and oriented to person, place, and time.  Psychiatric:        Mood and Affect: Mood normal.        Behavior: Behavior normal.         Thought Content: Thought content normal.        Judgment: Judgment normal.     Assessment & Plan   Assessment and Plan    Migraine with aura Visual aura precedes headaches. Topamax  not tolerated due to side effects. - Discontinue topamax . - Prescribed nortriptyline  10 mg nightly for migraine prophylaxis. - Monitor for dizziness and grogginess, expected to resolve within 1-2 weeks. - Discussed cumulative effect of nortriptyline  over 4-6 weeks for efficacy. - Advised Maxalt  10 mg oral dissolving tablet for acute attacks, repeatable after 2 hours if needed. - Permitted use of Advil, Excedrin Migraine, or Tylenol with Maxalt . - Provided drug information on nortriptyline . - Confirmed no interaction with birth control. - Advised to contact provider with questions or issues.      Follow up   Return in about 6 weeks (around 06/03/2024) for Headache follow up (ok to be virtual).  __________________________________ Zada FREDRIK Palin, DNP, APRN, FNP-BC Primary Care and Sports Medicine [facility] [place]

## 2024-04-22 NOTE — Patient Instructions (Signed)
Nortriptyline Capsules What is this medication? NORTRIPTYLINE (nor TRIP ti leen) treats depression. It increases the amount of serotonin and norepinephrine in the brain, substances that help regulate mood. It belongs to a group of medications called tricyclic antidepressants (TCAs). This medicine may be used for other purposes; ask your health care provider or pharmacist if you have questions. COMMON BRAND NAME(S): Aventyl, Pamelor What should I tell my care team before I take this medication? They need to know if you have any of these conditions: Bipolar disorder Brugada syndrome Frequently drink alcohol Glaucoma Heart or blood vessel conditions History of heart attack or stroke Liver disease Schizophrenia Seizures Suicidal thoughts, plans, or attempt by you or a family member Thyroid disease Trouble passing urine An unusual or allergic reaction to nortriptyline, other medications, foods, dyes, or preservatives Pregnant or trying to get pregnant Breastfeeding How should I use this medication? Take this medication by mouth with a glass of water. Follow the directions on the prescription label. Take your doses at regular intervals. Do not take it more often than directed. Do not stop taking this medication suddenly except upon the advice of your care team. Stopping this medication too quickly may cause serious side effects or your condition may worsen. A special MedGuide will be given to you by the pharmacist with each prescription and refill. Be sure to read this information carefully each time. Talk to your care team about the use of this medication in children. Special care may be needed. Overdosage: If you think you have taken too much of this medicine contact a poison control center or emergency room at once. NOTE: This medicine is only for you. Do not share this medicine with others. What if I miss a dose? If you miss a dose, take it as soon as you can. If it is almost time for your  next dose, take only that dose. Do not take double or extra doses. What may interact with this medication? Do not take this medication with any of the following: Cisapride Dronedarone Linezolid MAOIs, such as Marplan, Nardil, and Parnate Methylene blue (injected into a vein) Pimozide Thioridazine This medication may also interact with the following: Alcohol Antihistamines for allergy, cough, and cold Atropine Buspirone Certain medications for bladder problems, such as oxybutynin or tolterodine Certain medications for depression, such as amitriptyline, fluoxetine, sertraline Certain medications for headaches, such as sumatriptan or rizatriptan Certain medications for Parkinson disease, such as benztropine or trihexyphenidyl Certain medications for stomach problems, such as dicyclomine or hyoscyamine Certain medications for travel sickness, such as scopolamine Chlorpropamide Cimetidine Fentanyl Ipratropium Lithium Other medications that cause heart rhythm changes, such as dofetilide Quinidine Reserpine St. John's wort Thyroid medication Tramadol Tryptophan This list may not describe all possible interactions. Give your health care provider a list of all the medicines, herbs, non-prescription drugs, or dietary supplements you use. Also tell them if you smoke, drink alcohol, or use illegal drugs. Some items may interact with your medicine. What should I watch for while using this medication? Visit your care team for regular checks on your progress. It may be some time before you see the benefit from this medication. This medication may cause thoughts of suicide or depression. This includes sudden changes in mood, behaviors, or thoughts. These changes can happen at any time but are more common in the beginning of treatment or after a change in dose. Call your care team right away if you experience these thoughts or worsening depression. This medication may affect your coordination,  reaction time, or judgment. Do not drive or operate machinery until you know how this medication affects you. Sit up or stand slowly to reduce the risk of dizzy or fainting spells. Drinking alcohol with this medication can increase the risk of these side effects. Your mouth may get dry. Chewing sugarless gum or sucking hard candy and drinking plenty of water may help. Contact your care team if the problem does not go away or is severe. This medication may cause dry eyes and blurred vision. If you wear contact lenses, you may feel some discomfort. Lubricating eye drops may help. See your care team if the problem does not go away or is severe. This medication will cause constipation. If you do not have a bowel movement for 3 days, call your care team. This medication can make you more sensitive to the sun. Keep out of the sun. If you cannot avoid being in the sun, wear protective clothing and sunscreen. Do not use sun lamps, tanning beds, or tanning booths. What side effects may I notice from receiving this medication? Side effects that you should report to your care team as soon as possible: Allergic reactions--skin rash, itching, hives, swelling of the face, lips, tongue, or throat Heart rhythm changes--fast or irregular heartbeat, dizziness, feeling faint or lightheaded, chest pain, trouble breathing Irritability, confusion, fast or irregular heartbeat, muscle stiffness, twitching muscles, sweating, high fever, seizure, chills, vomiting, diarrhea, which may be signs of serotonin syndrome Seizures Sudden eye pain or change in vision such as blurry vision, seeing halos around lights, vision loss Thoughts of suicide or self-harm, worsening mood, feelings of depression Trouble passing urine Side effects that usually do not require medical attention (report to your care team if they continue or are bothersome): Change in sex drive or performance Constipation Dizziness Drowsiness Dry mouth Tremors or  shaking This list may not describe all possible side effects. Call your doctor for medical advice about side effects. You may report side effects to FDA at 1-800-FDA-1088. Where should I keep my medication? Keep out of the reach of children. Store at room temperature between 15 and 30 degrees C (59 and 86 degrees F). Keep container tightly closed. Throw away any unused medication after the expiration date. NOTE: This sheet is a summary. It may not cover all possible information. If you have questions about this medicine, talk to your doctor, pharmacist, or health care provider.  2024 Elsevier/Gold Standard (2022-12-19 00:00:00)

## 2024-05-06 ENCOUNTER — Encounter: Payer: Self-pay | Admitting: Medical-Surgical

## 2024-05-12 ENCOUNTER — Ambulatory Visit: Admitting: Dermatology

## 2024-05-12 DIAGNOSIS — D1801 Hemangioma of skin and subcutaneous tissue: Secondary | ICD-10-CM | POA: Diagnosis not present

## 2024-05-12 DIAGNOSIS — L700 Acne vulgaris: Secondary | ICD-10-CM

## 2024-05-12 DIAGNOSIS — L814 Other melanin hyperpigmentation: Secondary | ICD-10-CM | POA: Diagnosis not present

## 2024-05-12 DIAGNOSIS — L813 Cafe au lait spots: Secondary | ICD-10-CM

## 2024-05-12 DIAGNOSIS — L821 Other seborrheic keratosis: Secondary | ICD-10-CM

## 2024-05-12 DIAGNOSIS — Z1283 Encounter for screening for malignant neoplasm of skin: Secondary | ICD-10-CM | POA: Diagnosis not present

## 2024-05-12 DIAGNOSIS — Z808 Family history of malignant neoplasm of other organs or systems: Secondary | ICD-10-CM

## 2024-05-12 DIAGNOSIS — D229 Melanocytic nevi, unspecified: Secondary | ICD-10-CM

## 2024-05-12 MED ORDER — TRETINOIN MICROSPHERE 0.04 % EX GEL: CUTANEOUS | 11 refills | Status: AC

## 2024-05-12 NOTE — Progress Notes (Signed)
   Follow-Up Visit   Subjective  Cynthia Reynolds is a 28 y.o. female who presents for the following: Skin Cancer Screening and Full Body Skin Exam  The patient presents for Total-Body Skin Exam (TBSE) for skin cancer screening and mole check. The patient has spots, moles and lesions to be evaluated, some may be new or changing. No history of tanning bed use.  Family h/o skin cancer, not sure what type.   The following portions of the chart were reviewed this encounter and updated as appropriate: medications, allergies, medical history  Review of Systems:  No other skin or systemic complaints except as noted in HPI or Assessment and Plan.  Objective  Well appearing patient in no apparent distress; mood and affect are within normal limits.  A full examination was performed including scalp, head, eyes, ears, nose, lips, neck, chest, axillae, abdomen, back, buttocks, bilateral upper extremities, bilateral lower extremities, hands, feet, fingers, toes, fingernails, and toenails. All findings within normal limits unless otherwise noted below.   Relevant physical exam findings are noted in the Assessment and Plan.           Assessment & Plan   SKIN CANCER SCREENING PERFORMED TODAY.   LENTIGINES, SEBORRHEIC KERATOSES - Benign normal skin lesions - Benign-appearing - Call for any changes  MELANOCYTIC NEVI - Tan-brown and/or pink-flesh-colored symmetric macules and papules, including left plantar foot and right lateral foot - photos taken today. - Benign appearing on exam today - Observation - Call clinic for new or changing moles - Recommend daily use of broad spectrum spf 30+ sunscreen to sun-exposed areas.   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 Condition is symptomatic/ bothersome to patient. Not currently at goal.   Treatment Plan: Start tretinoin  microsphere 0.04% gel nightly to face as tolerated dsp 45 1 yr Rf.  Topical retinoid medications like tretinoin /Retin-A , adapalene/Differin, tazarotene/Fabior, and Epiduo/Epiduo Forte can cause dryness and irritation when first started. Only apply a pea-sized amount to the entire affected area. Avoid applying it around the eyes, edges of mouth and creases at the nose. If you experience irritation, use a good moisturizer first and/or apply the medicine less often. If you are doing well with the medicine, you can increase how often you use it until you are applying every night. Be careful with sun protection while using this medication as it can make you sensitive to the sun. This medicine should not be used by pregnant women.       Return if symptoms worsen or fail to improve.  IAndrea Kerns, CMA, am acting as scribe for Rexene Rattler, MD .   Documentation: I have reviewed the above documentation for accuracy and completeness, and I agree with the above.  Rexene Rattler, MD

## 2024-05-12 NOTE — Patient Instructions (Addendum)
 Basic OTC daily skin care regimen to prevent photoaging:   Recommend facial moisturizer with sunscreen SPF 30 every morning (OTC brands include CeraVe AM, Neutrogena, Eucerin, Cetaphil, Aveeno, La Roche Posay).  Can also apply a topical Vit C serum which is an antioxidant (OTC brands include CeraVe, La Roche Posay, and The Ordinary) underneath sunscreen in morning. If you are outside during the day in the summer for extended periods, especially swimming and/or sweating, make sure you apply a water resistant facial sunscreen lotion spf 30 or higher.   At night recommend a cream with retinol (a vitamin A derivative which stimulates collagen production) like CeraVe skin renewing retinol serum or ROC retinol correxion cream or Neutrogena rapid wrinkle repair cream. Retinol may cause skin irritation in people with sensitive skin.  Can use it every other day and/or apply on top of a hyaluronic acid (HA) moisturizer/serum (Neutrogena Hydroboost water cream) if better tolerated that way.  Retinol may also help with lightening brown spots.   Our office sells high quality, medically tested skin care lines such as Elta MD sunscreens (with Zinc), and Alastin skin care products, which are very effective in treating photoaging. The Alastin line includes cosmeceutical grade Vit.C serum, HA serum, Elastin stimulating moisturizers/serums, lightening serum, and sunscreens.  If you want prescription treatment, then you would need an appointment (Rx tretinoin  and fade creams, Botox, filler injections, laser treatments, etc.) These prescriptions and procedures are not covered by insurance but work very well.    Melanoma ABCDEs  Melanoma is the most dangerous type of skin cancer, and is the leading cause of death from skin disease.  You are more likely to develop melanoma if you: Have light-colored skin, light-colored eyes, or red or blond hair Spend a lot of time in the sun Tan regularly, either outdoors or in a tanning  bed Have had blistering sunburns, especially during childhood Have a close family member who has had a melanoma Have atypical moles or large birthmarks  Early detection of melanoma is key since treatment is typically straightforward and cure rates are extremely high if we catch it early.   The first sign of melanoma is often a change in a mole or a new dark spot.  The ABCDE system is a way of remembering the signs of melanoma.  A for asymmetry:  The two halves do not match. B for border:  The edges of the growth are irregular. C for color:  A mixture of colors are present instead of an even brown color. D for diameter:  Melanomas are usually (but not always) greater than 6mm - the size of a pencil eraser. E for evolution:  The spot keeps changing in size, shape, and color.  Please check your skin once per month between visits. You can use a small mirror in front and a large mirror behind you to keep an eye on the back side or your body.   If you see any new or changing lesions before your next follow-up, please call to schedule a visit.  Please continue daily skin protection including broad spectrum sunscreen SPF 30+ to sun-exposed areas, reapplying every 2 hours as needed when you're outdoors.   Staying in the shade or wearing long sleeves, sun glasses (UVA+UVB protection) and wide brim hats (4-inch brim around the entire circumference of the hat) are also recommended for sun protection.     Due to recent changes in healthcare laws, you may see results of your pathology and/or laboratory studies on MyChart  before the doctors have had a chance to review them. We understand that in some cases there may be results that are confusing or concerning to you. Please understand that not all results are received at the same time and often the doctors may need to interpret multiple results in order to provide you with the best plan of care or course of treatment. Therefore, we ask that you please give  us  2 business days to thoroughly review all your results before contacting the office for clarification. Should we see a critical lab result, you will be contacted sooner.   If You Need Anything After Your Visit  If you have any questions or concerns for your doctor, please call our main line at [phone] and press option 4 to reach your doctor's medical assistant. If no one answers, please leave a voicemail as directed and we will return your call as soon as possible. Messages left after 4 pm will be answered the following business day.   You may also send us  a message via MyChart. We typically respond to MyChart messages within 1-2 business days.  For prescription refills, please ask your pharmacy to contact our office. Our fax number is [phone].  If you have an urgent issue when the clinic is closed that cannot wait until the next business day, you can page your doctor at the number below.    Please note that while we do our best to be available for urgent issues outside of office hours, we are not available 24/7.   If you have an urgent issue and are unable to reach us , you may choose to seek medical care at your doctor's office, retail clinic, urgent care center, or emergency room.  If you have a medical emergency, please immediately call 911 or go to the emergency department.  Pager Numbers  - Dr. Hester: [phone]  - Dr. Jackquline: [phone]  - Dr. Claudene: [phone]   - Dr. Raymund: [phone]  In the event of inclement weather, please call our main line at [phone] for an update on the status of any delays or closures.  Dermatology Medication Tips: Please keep the boxes that topical medications come in in order to help keep track of the instructions about where and how to use these. Pharmacies typically print the medication instructions only on the boxes and not directly on the medication tubes.   If your medication is too expensive, please contact our  office at [phone] option 4 or send us  a message through MyChart.   We are unable to tell what your co-pay for medications will be in advance as this is different depending on your insurance coverage. However, we may be able to find a substitute medication at lower cost or fill out paperwork to get insurance to cover a needed medication.   If a prior authorization is required to get your medication covered by your insurance company, please allow us  1-2 business days to complete this process.  Drug prices often vary depending on where the prescription is filled and some pharmacies may offer cheaper prices.  The website www.goodrx.com contains coupons for medications through different pharmacies. The prices here do not account for what the cost may be with help from insurance (it may be cheaper with your insurance), but the website can give you the price if you did not use any insurance.  - You can print the associated coupon and take it with your prescription to the pharmacy.  - You may also stop by  our office during regular business hours and pick up a GoodRx coupon card.  - If you need your prescription sent electronically to a different pharmacy, notify our office through [facility] or by phone at [phone] option 4.     Si Usted Necesita Algo Despus de Su Visita  Tambin puede enviarnos un mensaje a travs de Clinical cytogeneticist. Por lo general respondemos a los mensajes de MyChart en el transcurso de 1 a 2 das hbiles.  Para renovar recetas, por favor pida a su farmacia que se ponga en contacto con nuestra oficina. Randi lakes de fax es [place] [phone].  Si tiene un asunto urgente cuando la clnica est cerrada y que no puede esperar hasta el siguiente da hbil, puede llamar/localizar a su doctor(a) al nmero que aparece a continuacin.   Por favor, tenga en cuenta que aunque hacemos todo lo posible para estar disponibles para asuntos urgentes fuera del horario de [place], no  estamos disponibles las 24 horas del da, los 7 [address]  [address] de la [place].   Si tiene un problema urgente y no puede comunicarse con nosotros, puede optar por buscar atencin mdica  en el consultorio de su doctor(a), en una clnica privada, en un centro de atencin urgente o en una sala de emergencias.  Si tiene Engineer, drilling, por favor llame inmediatamente al 911 o vaya a la sala de emergencias.  Nmeros de bper  - Dr. Hester: [phone]  - Dra. Jackquline: [phone]  - Dr. Claudene: [phone]   En caso de inclemencias del tiempo, por favor llame a landry capes principal al [phone] para una actualizacin sobre el [place] de cualquier retraso o cierre.  Consejos para la medicacin en dermatologa: Por favor, guarde las cajas en las que vienen los medicamentos de uso tpico para ayudarle a seguir las instrucciones sobre dnde y cmo usarlos. Las farmacias generalmente imprimen las instrucciones del medicamento slo en las cajas y no directamente en los tubos del [place].   Si su medicamento es muy caro, por favor, pngase en contacto con landry rieger llamando al [phone] y presione la opcin 4 o envenos un mensaje a travs de Clinical cytogeneticist.   No podemos decirle cul ser su copago por los medicamentos por adelantado ya que esto es diferente dependiendo de la cobertura de su seguro. Sin embargo, es posible que podamos encontrar un medicamento sustituto a Audiological scientist un formulario para que el seguro cubra el medicamento que se considera necesario.   Si se requiere una autorizacin previa para que su compaa de seguros malta su medicamento, por favor permtanos de 1 a 2 das hbiles para completar este proceso.  Los precios de los medicamentos varan con frecuencia dependiendo del Environmental consultant de dnde se surte la receta y alguna farmacias pueden ofrecer precios ms baratos.  El sitio web www.goodrx.com tiene cupones para medicamentos de Health and safety inspector. Los precios  aqu no tienen en cuenta lo que podra costar con la ayuda del seguro (puede ser ms barato con su seguro), pero el sitio web puede darle el precio si no utiliz Tourist information centre manager.  - Puede imprimir el cupn correspondiente y llevarlo con su receta a la farmacia.  - Tambin puede pasar por nuestra oficina durante el horario de atencin regular y Education officer, museum una tarjeta de cupones de GoodRx.  - Si necesita que su receta se enve electrnicamente a una farmacia diferente, informe a nuestra oficina a travs de MyChart de [place] o por telfono llamando al [phone] y presione la opcin 4.

## 2024-05-22 ENCOUNTER — Encounter: Payer: Self-pay | Admitting: Medical-Surgical

## 2024-06-12 ENCOUNTER — Encounter: Payer: Self-pay | Admitting: Medical-Surgical

## 2024-06-22 ENCOUNTER — Other Ambulatory Visit: Payer: Self-pay | Admitting: Medical-Surgical

## 2024-06-23 MED ORDER — NORTRIPTYLINE HCL 10 MG PO CAPS: 20.0000 mg | ORAL_CAPSULE | Freq: Every day | ORAL | 1 refills | Status: AC
